# Patient Record
Sex: Male | Born: 1970 | Race: White | Hispanic: No | Marital: Single | Smoking: Never smoker
Health system: Southern US, Community
[De-identification: ages and names within clinical notes are randomized; demographics above are authoritative.]

## PROBLEM LIST (undated history)

## (undated) DIAGNOSIS — I471 Supraventricular tachycardia: Secondary | ICD-10-CM

## (undated) HISTORY — PX: ABLATION: SHX5711

---

## 2008-11-13 ENCOUNTER — Emergency Department: Payer: Self-pay | Admitting: Internal Medicine

## 2008-11-17 ENCOUNTER — Ambulatory Visit: Payer: Self-pay | Admitting: Internal Medicine

## 2010-07-22 ENCOUNTER — Emergency Department: Payer: Self-pay | Admitting: Unknown Physician Specialty

## 2014-04-10 ENCOUNTER — Emergency Department: Payer: Self-pay | Admitting: Emergency Medicine

## 2014-04-10 LAB — MAGNESIUM: Magnesium: 2 mg/dL

## 2014-04-10 LAB — CBC
HCT: 47.2 % (ref 40.0–52.0)
HGB: 15.5 g/dL (ref 13.0–18.0)
MCH: 30.2 pg (ref 26.0–34.0)
MCHC: 32.8 g/dL (ref 32.0–36.0)
MCV: 92 fL (ref 80–100)
PLATELETS: 291 10*3/uL (ref 150–440)
RBC: 5.12 10*6/uL (ref 4.40–5.90)
RDW: 12.7 % (ref 11.5–14.5)
WBC: 9.9 10*3/uL (ref 3.8–10.6)

## 2014-04-10 LAB — BASIC METABOLIC PANEL
BUN: 15 mg/dL (ref 7–18)
CALCIUM: 8.4 mg/dL — AB (ref 8.5–10.1)
CREATININE: 1.36 mg/dL — AB (ref 0.60–1.30)
Chloride: 105 mmol/L (ref 98–107)
Co2: 26 mmol/L (ref 21–32)
GLUCOSE: 129 mg/dL — AB (ref 65–99)
POTASSIUM: 3.8 mmol/L (ref 3.5–5.1)
Sodium: 142 mmol/L (ref 136–145)

## 2014-10-27 ENCOUNTER — Emergency Department
Admit: 2014-10-27 | Disposition: A | Discharge status: Home / Self Care | Payer: Self-pay | Admitting: Emergency Medicine

## 2014-10-27 LAB — BASIC METABOLIC PANEL
Anion Gap: 11 (ref 7–16)
BUN: 16 mg/dL
CALCIUM: 9.1 mg/dL
Chloride: 106 mmol/L
Co2: 24 mmol/L
Creatinine: 1.16 mg/dL
EGFR (African American): >60
EGFR (Non-African Amer.): >60
GLUCOSE: 134 mg/dL — AB
Potassium: 3.9 mmol/L
Sodium: 141 mmol/L

## 2014-10-27 LAB — CBC
HCT: 47.3 % (ref 40.0–52.0)
HGB: 15.7 g/dL (ref 13.0–18.0)
MCH: 30.2 pg (ref 26.0–34.0)
MCHC: 33.3 g/dL (ref 32.0–36.0)
MCV: 91 fL (ref 80–100)
Platelet: 279 10*3/uL (ref 150–440)
RBC: 5.21 10*6/uL (ref 4.40–5.90)
RDW: 12.8 % (ref 11.5–14.5)
WBC: 9.8 10*3/uL (ref 3.8–10.6)

## 2014-12-31 DIAGNOSIS — I471 Supraventricular tachycardia, unspecified: Secondary | ICD-10-CM

## 2014-12-31 HISTORY — DX: Supraventricular tachycardia: I47.1

## 2014-12-31 HISTORY — DX: Supraventricular tachycardia, unspecified: I47.10

## 2017-06-12 ENCOUNTER — Ambulatory Visit
Admission: EM | Admit: 2017-06-12 | Discharge: 2017-06-12 | Disposition: A | Discharge status: Home / Self Care | Payer: BLUE CROSS/BLUE SHIELD | Attending: Family Medicine | Admitting: Family Medicine

## 2017-06-12 ENCOUNTER — Other Ambulatory Visit: Payer: Self-pay

## 2017-06-12 DIAGNOSIS — S46811A Strain of other muscles, fascia and tendons at shoulder and upper arm level, right arm, initial encounter: Secondary | ICD-10-CM

## 2017-06-12 DIAGNOSIS — X500XXA Overexertion from strenuous movement or load, initial encounter: Secondary | ICD-10-CM

## 2017-06-12 HISTORY — DX: Supraventricular tachycardia: I47.1

## 2017-06-12 MED ORDER — KETOROLAC TROMETHAMINE 60 MG/2ML IM SOLN
60.0000 mg | Freq: Once | INTRAMUSCULAR | Status: AC
  Administered 2017-06-12: 60 mg via INTRAMUSCULAR

## 2017-06-12 MED ORDER — CYCLOBENZAPRINE HCL 10 MG PO TABS: 10.0000 mg | ORAL_TABLET | Freq: Two times a day (BID) | ORAL | 0 refills | Status: DC | PRN

## 2017-06-12 MED ORDER — MELOXICAM 15 MG PO TABS
15.0000 mg | ORAL_TABLET | Freq: Every day | ORAL | 0 refills | Status: DC | PRN
Start: 2017-06-12 — End: 2017-06-16

## 2017-06-12 NOTE — ED Triage Notes (Signed)
Rt shoulder and neck pain since yesterday shoveling snow.

## 2017-06-12 NOTE — ED Provider Notes (Signed)
[facility] ____________________________________________  Time seen: Approximately 8:51 AM  I have reviewed the triage vital signs and the nursing notes.   HISTORY  Chief Complaint Shoulder Pain   HPI Jeremy AmericanJoseph A Yakel Jr. is a 46 y.o. male presenting for evaluation of right neck shoulder pain that started yesterday afternoon.  Patient reports he is right-hand dominant and has been doing a lot of shoveling snow the last few days.  Also reports that he went bowling yesterday.  States yesterday afternoon he started noticing some pain that he felt like has overall worsened.  Denies pain radiation, decreased strength, fall.  Patient reports yesterday he had a few brief intermittent episodes of a slight numbness in his right forearm that lasted only a few seconds and has not been continuous.  Denies any numbness at this time.  States pain is to his right shoulder and is constant, but does worsen with arm movement.  Denies any fall or direct injury.  States has tried over-the-counter ibuprofen and used his wife's TENS unit last night without change.  Denies associated rash, chest pain, shortness of breath, weakness, dizziness, other complaints.  Denies known fevers.  Reports otherwise feels well.  Denies chest pain, shortness of breath, abdominal pain, or rash. Denies recent sickness. Denies recent antibiotic use.  Denies renal insufficiency.    Past Medical History:  Diagnosis Date  . SVT (supraventricular tachycardia) (HCC) 12/2014    There are no active problems to display for this patient.   Past Surgical History:  Procedure Laterality Date  . ABLATION     SVT     No current facility-administered medications for this encounter.   Current Outpatient Medications:  .  cyclobenzaprine (FLEXERIL) 10 MG tablet, Take 1 tablet (10 mg total) by mouth 2 (two) times daily as needed for muscle spasms. Do not drive while taking as can cause drowsiness, Disp: 15 tablet, Rfl: 0 .   meloxicam (MOBIC) 15 MG tablet, Take 1 tablet (15 mg total) by mouth daily as needed., Disp: 10 tablet, Rfl: 0  Allergies Patient has no allergy information on record.  Family History  Problem Relation Age of Onset  . Cancer Father   . Diabetes Maternal Grandmother     Social History Social History   Tobacco Use  . Smoking status: Never Smoker  . Smokeless tobacco: Never Used  Substance Use Topics  . Alcohol use: Yes  . Drug use: Not on file    Review of Systems Constitutional: No fever/chills Cardiovascular: Denies chest pain. Respiratory: Denies shortness of breath. Gastrointestinal: No abdominal pain.  Musculoskeletal: As above.  Skin: Negative for rash.   ____________________________________________  PHYSICAL EXAM:   VITAL SIGNS:  ED Triage Vitals  Enc Vitals Group     BP --      Pulse Rate 06/12/17 0834 76     Resp 06/12/17 0834 16     Temp 06/12/17 0834 98.1 F (36.7 C)     Temp Source 06/12/17 0834 Oral     SpO2 06/12/17 0834 99 %     Weight 06/12/17 0835 204 lb 9.6 oz (92.8 kg)     Height 06/12/17 0835 5\' 8"  (1.727 m)     Head Circumference --      Peak Flow --      Pain Score 06/12/17 0836 8     Pain Loc --      Pain Edu? --      Excl. in GC? --    Vitals:  06/12/17 0834 06/12/17 0835 06/12/17 1007  BP:   126/82  Pulse: 76    Resp: 16    Temp: 98.1 F (36.7 C)    TempSrc: Oral    SpO2: 99%    Weight:  204 lb 9.6 oz (92.8 kg)   Height:  5\' 8"  (1.727 m)      Constitutional: Alert and oriented. Well appearing and in no acute distress. ENT      Head: Normocephalic and atraumatic. Neck: No stridor. Supple without meningismus.  Hematological/Lymphatic/Immunilogical: No cervical lymphadenopathy. Cardiovascular: Normal rate, regular rhythm. Grossly normal heart sounds.  Good peripheral circulation. Respiratory: Normal respiratory effort without tachypnea nor retractions. Breath sounds are clear and equal bilaterally. No wheezes, rales,  rhonchi. Musculoskeletal: . No midline cervical, thoracic or lumbar tenderness to palpation. Bilateral distal radial pulses equal and easily palpated.   Except: Right trapezius mild to moderate tenderness to direct palpation, no pain with cervical flexion or extension, pain to right trapezius with cervical rotation right and left, mild palpable muscle spasm, no midline tenderness, no bony shoulder tenderness, negative drop arm test, negative impingement, full range of motion present to right shoulder, bilateral hand grip strong and equal, no motor or tendon deficits noted to right upper extremity and sensation intact to upper extremities bilaterally. Neurologic:  Normal speech and language. No gross focal neurologic deficits are appreciated. Speech is normal. No gait instability.  Skin:  Skin is warm, dry and intact. No rash noted. Psychiatric: Mood and affect are normal. Speech and behavior are normal. Patient exhibits appropriate insight and judgment   ___________________________________________   LABS (all labs ordered are listed, but only abnormal results are displayed)  Labs Reviewed - No data to display ____________________________________________   PROCEDURES Procedures    INITIAL IMPRESSION / ASSESSMENT AND PLAN / ED COURSE  Pertinent labs & imaging results that were available during my care of the patient were reviewed by me and considered in my medical decision making (see chart for details).  Well appearing. No acute distress.  Suspect right trapezius muscle strain.  60 mg IM Toradol given once in urgent care.  Will treat with daily Mobic and as needed muscle relaxant.  Encourage rest, fluids, stretching, avoidance of strenuous activity and supportive care. Discussed indication, risks and benefits of medications with patient.  Discussed follow up with Primary care physician this week. Discussed follow up and return parameters including no resolution or any worsening concerns.  Patient verbalized understanding and agreed to plan.   ____________________________________________   FINAL CLINICAL IMPRESSION(S) / ED DIAGNOSES  Final diagnoses:  Strain of right trapezius muscle, initial encounter     ED Discharge Orders        Ordered    meloxicam (MOBIC) 15 MG tablet  Daily PRN     06/12/17 0912    cyclobenzaprine (FLEXERIL) 10 MG tablet  2 times daily PRN     06/12/17 0912       Note: This dictation was prepared with Dragon dictation along with smaller phrase technology. Any transcriptional errors that result from this process are unintentional.         Renford DillsMiller, Docie Abramovich, NP 06/12/17 1306

## 2017-06-12 NOTE — Discharge Instructions (Signed)
Take medication as prescribed. Rest. Drink plenty of fluids. Stretch. Avoid strenuous activity. No alcohol with muscle relaxant.  Follow up with your primary care physician this week as needed. Return to Urgent care for new or worsening concerns.

## 2017-06-15 ENCOUNTER — Emergency Department: Payer: BLUE CROSS/BLUE SHIELD

## 2017-06-15 ENCOUNTER — Telehealth: Payer: Self-pay

## 2017-06-15 ENCOUNTER — Emergency Department
Admission: EM | Admit: 2017-06-15 | Discharge: 2017-06-15 | Disposition: A | Discharge status: Home / Self Care | Payer: BLUE CROSS/BLUE SHIELD | Attending: Emergency Medicine | Admitting: Emergency Medicine

## 2017-06-15 ENCOUNTER — Encounter: Payer: Self-pay | Admitting: Radiology

## 2017-06-15 DIAGNOSIS — R509 Fever, unspecified: Secondary | ICD-10-CM | POA: Insufficient documentation

## 2017-06-15 DIAGNOSIS — M25511 Pain in right shoulder: Secondary | ICD-10-CM | POA: Insufficient documentation

## 2017-06-15 LAB — TROPONIN I

## 2017-06-15 LAB — COMPREHENSIVE METABOLIC PANEL
ALK PHOS: 63 U/L (ref 38–126)
ALT: 53 U/L (ref 17–63)
ANION GAP: 10 (ref 5–15)
AST: 38 U/L (ref 15–41)
Albumin: 3.6 g/dL (ref 3.5–5.0)
BUN: 13 mg/dL (ref 6–20)
CALCIUM: 8.6 mg/dL — AB (ref 8.9–10.3)
CO2: 21 mmol/L — AB (ref 22–32)
Chloride: 103 mmol/L (ref 101–111)
Creatinine, Ser: 1.08 mg/dL (ref 0.61–1.24)
GFR calc non Af Amer: >60 mL/min (ref >60)
Glucose, Bld: 140 mg/dL — ABNORMAL HIGH (ref 65–99)
Potassium: 3.2 mmol/L — ABNORMAL LOW (ref 3.5–5.1)
SODIUM: 134 mmol/L — AB (ref 135–145)
TOTAL PROTEIN: 7.7 g/dL (ref 6.5–8.1)
Total Bilirubin: 1.6 mg/dL — ABNORMAL HIGH (ref 0.3–1.2)

## 2017-06-15 LAB — BLOOD CULTURE ID PANEL (REFLEXED)
Acinetobacter baumannii: NOT DETECTED
CANDIDA ALBICANS: NOT DETECTED
CANDIDA GLABRATA: NOT DETECTED
Candida krusei: NOT DETECTED
Candida parapsilosis: NOT DETECTED
Candida tropicalis: NOT DETECTED
ENTEROBACTER CLOACAE COMPLEX: NOT DETECTED
ENTEROBACTERIACEAE SPECIES: NOT DETECTED
ENTEROCOCCUS SPECIES: NOT DETECTED
Escherichia coli: NOT DETECTED
Haemophilus influenzae: NOT DETECTED
Klebsiella oxytoca: NOT DETECTED
Klebsiella pneumoniae: NOT DETECTED
LISTERIA MONOCYTOGENES: NOT DETECTED
METHICILLIN RESISTANCE: NOT DETECTED
NEISSERIA MENINGITIDIS: NOT DETECTED
Proteus species: NOT DETECTED
Pseudomonas aeruginosa: NOT DETECTED
STAPHYLOCOCCUS SPECIES: DETECTED — AB
STREPTOCOCCUS AGALACTIAE: NOT DETECTED
STREPTOCOCCUS SPECIES: NOT DETECTED
Serratia marcescens: NOT DETECTED
Staphylococcus aureus (BCID): DETECTED — AB
Streptococcus pneumoniae: NOT DETECTED
Streptococcus pyogenes: NOT DETECTED

## 2017-06-15 LAB — CBC WITH DIFFERENTIAL/PLATELET
BASOS ABS: 0 10*3/uL (ref 0–0.1)
BASOS PCT: 0 %
Eosinophils Absolute: 0 10*3/uL (ref 0–0.7)
Eosinophils Relative: 0 %
HEMATOCRIT: 39.5 % — AB (ref 40.0–52.0)
HEMOGLOBIN: 13.8 g/dL (ref 13.0–18.0)
Lymphocytes Relative: 8 %
Lymphs Abs: 0.7 10*3/uL — ABNORMAL LOW (ref 1.0–3.6)
MCH: 30.9 pg (ref 26.0–34.0)
MCHC: 35 g/dL (ref 32.0–36.0)
MCV: 88.2 fL (ref 80.0–100.0)
MONOS PCT: 12 %
Monocytes Absolute: 1 10*3/uL (ref 0.2–1.0)
NEUTROS ABS: 6.8 10*3/uL — AB (ref 1.4–6.5)
NEUTROS PCT: 80 %
Platelets: 205 10*3/uL (ref 150–440)
RBC: 4.47 MIL/uL (ref 4.40–5.90)
RDW: 12.6 % (ref 11.5–14.5)
WBC: 8.5 10*3/uL (ref 3.8–10.6)

## 2017-06-15 LAB — INFLUENZA PANEL BY PCR (TYPE A & B)
Influenza A By PCR: NEGATIVE
Influenza B By PCR: NEGATIVE

## 2017-06-15 LAB — LACTIC ACID, PLASMA: Lactic Acid, Venous: 1.3 mmol/L (ref 0.5–1.9)

## 2017-06-15 MED ORDER — HYDROMORPHONE HCL 1 MG/ML IJ SOLN
1.0000 mg | Freq: Once | INTRAMUSCULAR | Status: AC
  Administered 2017-06-15: 1 mg via INTRAVENOUS
  Filled 2017-06-15: qty 1

## 2017-06-15 MED ORDER — SODIUM CHLORIDE 0.9 % IV SOLN
Freq: Once | INTRAVENOUS | Status: AC
  Administered 2017-06-15: 07:00:00 via INTRAVENOUS

## 2017-06-15 MED ORDER — KETOROLAC TROMETHAMINE 30 MG/ML IJ SOLN
30.0000 mg | Freq: Once | INTRAMUSCULAR | Status: AC
  Administered 2017-06-15: 30 mg via INTRAVENOUS
  Filled 2017-06-15: qty 1

## 2017-06-15 MED ORDER — DIAZEPAM 5 MG PO TABS: 5.0000 mg | ORAL_TABLET | Freq: Three times a day (TID) | ORAL | 0 refills | Status: DC | PRN

## 2017-06-15 MED ORDER — IBUPROFEN 800 MG PO TABS: 800.0000 mg | ORAL_TABLET | Freq: Three times a day (TID) | ORAL | 0 refills | Status: DC | PRN

## 2017-06-15 MED ORDER — DIAZEPAM 5 MG PO TABS
10.0000 mg | ORAL_TABLET | Freq: Once | ORAL | Status: AC
  Administered 2017-06-15: 10 mg via ORAL
  Filled 2017-06-15: qty 2

## 2017-06-15 MED ORDER — HYDROMORPHONE HCL 1 MG/ML IJ SOLN
0.5000 mg | Freq: Once | INTRAMUSCULAR | Status: AC
  Administered 2017-06-15: 0.5 mg via INTRAVENOUS
  Filled 2017-06-15: qty 1

## 2017-06-15 MED ORDER — IOPAMIDOL (ISOVUE-300) INJECTION 61%
75.0000 mL | Freq: Once | INTRAVENOUS | Status: AC | PRN
  Administered 2017-06-15: 75 mL via INTRAVENOUS

## 2017-06-15 NOTE — ED Provider Notes (Signed)
[facility] Emergency Department Provider Note       Time seen: ----------------------------------------- 7:09 AM on 06/15/2017 -----------------------------------------   I have reviewed the triage vital signs and the nursing notes.  HISTORY   Chief Complaint Shoulder Pain    HPI Jeremy AmericanJoseph A Andal Jr. is a 46 y.o. male with a history of SVT who presents to the ED for right shoulder pain.  Patient is not able to move his right shoulder well.  He was seen in urgent care this week for similar was given Flexeril for was reported to be trapezius strain.  Patient states pain extends from his anterior shoulder to the right lateral neck and behind his back.  Patient reports she had a temperature of 101 yesterday.  He denies congestion, cough, nausea, vomiting or diarrhea.  Past Medical History:  Diagnosis Date  . SVT (supraventricular tachycardia) (HCC) 12/2014    There are no active problems to display for this patient.   Past Surgical History:  Procedure Laterality Date  . ABLATION     SVT    Allergies Patient has no known allergies.  Social History Social History   Tobacco Use  . Smoking status: Never Smoker  . Smokeless tobacco: Never Used  Substance Use Topics  . Alcohol use: Yes  . Drug use: Not on file    Review of Systems Constitutional: Positive for fever Eyes: Negative for vision changes ENT:  Negative for congestion, sore throat Cardiovascular: Negative for chest pain. Respiratory: Negative for shortness of breath. Gastrointestinal: Negative for abdominal pain, vomiting and diarrhea. Genitourinary: Negative for dysuria. Musculoskeletal: Positive for right shoulder pain Skin: Positive for diaphoresis Neurological: Negative for headaches, focal weakness or numbness.  All systems negative/normal/unremarkable except as stated in the HPI  ____________________________________________   PHYSICAL EXAM:  VITAL SIGNS: ED Triage Vitals   Enc Vitals Group     BP 06/15/17 0653 138/72     Pulse Rate 06/15/17 0653 (!) 111     Resp 06/15/17 0653 (!) 22     Temp 06/15/17 0653 (!) 100.4 F (38 C)     Temp Source 06/15/17 0653 Oral     SpO2 06/15/17 0653 95 %     Weight 06/15/17 0654 200 lb (90.7 kg)     Height 06/15/17 0654 5\' 8"  (1.727 m)     Head Circumference --      Peak Flow --      Pain Score 06/15/17 0653 9     Pain Loc --      Pain Edu? --      Excl. in GC? --     Constitutional: Alert and oriented.  Mild distress Eyes: Conjunctivae are normal. Normal extraocular movements. ENT   Head: Normocephalic and atraumatic.   Nose: No congestion/rhinnorhea.   Mouth/Throat: Mucous membranes are moist.   Neck: No stridor. Cardiovascular: Normal rate, regular rhythm. No murmurs, rubs, or gallops. Respiratory: Normal respiratory effort without tachypnea nor retractions. Breath sounds are clear and equal bilaterally. No wheezes/rales/rhonchi. Gastrointestinal: Soft and nontender. Normal bowel sounds Musculoskeletal: Significant pain with range of motion the right shoulder, no sensory or motor deficits are appreciated.  Specific tenderness over the trapezius but also right upper chest and right anterior lateral neck.  No specific tenderness or effusion is appreciated at the right shoulder Neurologic:  Normal speech and language. No gross focal neurologic deficits are appreciated.  Skin:  Skin is warm, with profuse diaphoresis Psychiatric: Mood and affect are normal. Speech and behavior are  normal.  ____________________________________________  EKG: Interpreted by me.  Sinus rhythm rate 71 bpm, normal PR interval, normal QRS, normal QT.  ____________________________________________  ED COURSE:  Pertinent labs & imaging results that were available during my care of the patient were reviewed by me and considered in my medical decision making (see chart for details). Patient presents for severe right shoulder  pain as well as fever, we will assess with labs and imaging as indicated.  Unclear etiology at this time Clinical Course as of Jun 15 1108  Sat Jun 15, 2017  [id number] IMPRESSION: 1. No definite acute or inflammatory process in the neck. 2. Scattered ethmoid and sphenoid paranasal sinusitis. 3. 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     [JW]    Clinical Course User Index [JW] Emily FilbertWilliams, Eion Timbrook E, MD   Procedures ____________________________________________   LABS (pertinent positives/negatives)  Labs Reviewed  COMPREHENSIVE METABOLIC PANEL - Abnormal; Notable for the following components:      Result Value   Sodium 134 (*)    Potassium 3.2 (*)    CO2 21 (*)    Glucose, Bld 140 (*)    Calcium 8.6 (*)    Total Bilirubin 1.6 (*)    All other components within normal limits  CBC WITH DIFFERENTIAL/PLATELET - Abnormal; Notable for the following components:   HCT 39.5 (*)    Neutro Abs 6.8 (*)    Lymphs Abs 0.7 (*)    All other components within normal limits  CULTURE, BLOOD (ROUTINE X 2)  CULTURE, BLOOD (ROUTINE X 2)  LACTIC ACID, PLASMA  INFLUENZA PANEL BY PCR (TYPE A & B)  TROPONIN I  URINALYSIS, COMPLETE (UACMP) WITH MICROSCOPIC    RADIOLOGY Images were viewed by me  Shoulder x-rays IMPRESSION: Bibasilar pulmonary opacities. The streaky and somewhat platelike nature suggests the possibility of atelectasis. However, given the history of fever, pneumonia is not excluded. Recommend follow-up to Resolution. IMPRESSION: 1. No definite acute or inflammatory process in the neck. 2. Scattered ethmoid and sphenoid paranasal sinusitis. 3. 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 ____________________________________________  DIFFERENTIAL DIAGNOSIS   Viral syndrome, muscle strain, bacteremia, abscess,  FINAL ASSESSMENT AND  PLAN  Fever, right shoulder pain   Plan: Patient had presented for worsening right shoulder pain as well as fever and diaphoresis. Patient's labs were reassuring. Patient's imaging was also reassuring.  No clear etiology for his constellation of symptoms.  I think Valium would work better for muscle relaxant with ibuprofen.  His fever is likely coming from a viral etiology.  He is stable for outpatient follow-up.   Emily FilbertWilliams, Royer Cristobal E, MD   Note: This note was generated in part or whole with voice recognition software. Voice recognition is usually quite accurate but there are transcription errors that can and very often do occur. I apologize for any typographical errors that were not detected and corrected.     Emily FilbertWilliams, Kiyani Jernigan E, MD 06/15/17 [phone]

## 2017-06-15 NOTE — Telephone Encounter (Signed)
Called and left a message for patient's wife to have the patient call back to ED in reference to possible admission for MSSA reported by the lab.

## 2017-06-15 NOTE — ED Triage Notes (Addendum)
Pt arrives to triage complaining of right shoulder pain. Pt is not able to adduct or abduct right shoulder. Pt states pain extends to right anterior shoulder and up right lateral neck to posterior skull. Pt is hot to touch and diaphoretic. Pt states he did have a fever of 101 yesterday at 1400. Pt appear uncomfortable. Pt denies shob or nausea.

## 2017-06-15 NOTE — ED Notes (Signed)
md at bedside

## 2017-06-15 NOTE — Progress Notes (Signed)
PHARMACY - PHYSICIAN COMMUNICATION CRITICAL VALUE ALERT - BLOOD CULTURE IDENTIFICATION (BCID)  Jeremy AmericanJoseph A Pigman Jr. is an 46 y.o. male who [facility]2017 with a chief complaint of shoulder pain  Assessment:   (include suspected source if known)  Name of physician (or Provider) Contacted: ED charge RN  Current antibiotics: seen and d/c from ED.  Changes to prescribed antibiotics recommended:  ED RN to follow up with EDP regarding therapy.  Results for orders placed or performed during the hospital encounter of 06/15/17  Blood Culture ID Panel (Reflexed) (Collected: 06/15/2017  7:05 AM)  Result Value Ref Range   Enterococcus species NOT DETECTED NOT DETECTED   Listeria monocytogenes NOT DETECTED NOT DETECTED   Staphylococcus species DETECTED (A) NOT DETECTED   Staphylococcus aureus DETECTED (A) NOT DETECTED   Methicillin resistance NOT DETECTED NOT DETECTED   Streptococcus species NOT DETECTED NOT DETECTED   Streptococcus agalactiae NOT DETECTED NOT DETECTED   Streptococcus pneumoniae NOT DETECTED NOT DETECTED   Streptococcus pyogenes NOT DETECTED NOT DETECTED   Acinetobacter baumannii NOT DETECTED NOT DETECTED   Enterobacteriaceae species NOT DETECTED NOT DETECTED   Enterobacter cloacae complex NOT DETECTED NOT DETECTED   Escherichia coli NOT DETECTED NOT DETECTED   Klebsiella oxytoca NOT DETECTED NOT DETECTED   Klebsiella pneumoniae NOT DETECTED NOT DETECTED   Proteus species NOT DETECTED NOT DETECTED   Serratia marcescens NOT DETECTED NOT DETECTED   Haemophilus influenzae NOT DETECTED NOT DETECTED   Neisseria meningitidis NOT DETECTED NOT DETECTED   Pseudomonas aeruginosa NOT DETECTED NOT DETECTED   Candida albicans NOT DETECTED NOT DETECTED   Candida glabrata NOT DETECTED NOT DETECTED   Candida krusei NOT DETECTED NOT DETECTED   Candida parapsilosis NOT DETECTED NOT DETECTED   Candida tropicalis NOT DETECTED NOT DETECTED    Jeremy Teska  Humphrey 06/15/2017  11:26 PM

## 2017-06-15 NOTE — ED Notes (Signed)
Report to butch, rn.  

## 2017-06-16 ENCOUNTER — Other Ambulatory Visit: Payer: Self-pay

## 2017-06-16 ENCOUNTER — Inpatient Hospital Stay
Admit: 2017-06-16 | Discharge: 2017-06-16 | Disposition: A | Discharge status: Home / Self Care | Payer: BLUE CROSS/BLUE SHIELD | Attending: Internal Medicine | Admitting: Internal Medicine

## 2017-06-16 ENCOUNTER — Emergency Department: Payer: BLUE CROSS/BLUE SHIELD

## 2017-06-16 ENCOUNTER — Inpatient Hospital Stay
Admission: EM | Admit: 2017-06-16 | Discharge: 2017-06-19 | DRG: 871 | Disposition: A | Discharge status: Home Health | Payer: BLUE CROSS/BLUE SHIELD | Attending: Internal Medicine | Admitting: Internal Medicine

## 2017-06-16 DIAGNOSIS — I471 Supraventricular tachycardia: Secondary | ICD-10-CM | POA: Diagnosis present

## 2017-06-16 DIAGNOSIS — J189 Pneumonia, unspecified organism: Secondary | ICD-10-CM | POA: Diagnosis present

## 2017-06-16 DIAGNOSIS — M25519 Pain in unspecified shoulder: Secondary | ICD-10-CM

## 2017-06-16 DIAGNOSIS — E876 Hypokalemia: Secondary | ICD-10-CM | POA: Diagnosis present

## 2017-06-16 DIAGNOSIS — A4101 Sepsis due to Methicillin susceptible Staphylococcus aureus: Secondary | ICD-10-CM | POA: Diagnosis present

## 2017-06-16 DIAGNOSIS — A419 Sepsis, unspecified organism: Secondary | ICD-10-CM | POA: Diagnosis present

## 2017-06-16 DIAGNOSIS — Z79899 Other long term (current) drug therapy: Secondary | ICD-10-CM | POA: Diagnosis not present

## 2017-06-16 DIAGNOSIS — M25511 Pain in right shoulder: Secondary | ICD-10-CM | POA: Diagnosis present

## 2017-06-16 DIAGNOSIS — R7881 Bacteremia: Secondary | ICD-10-CM | POA: Diagnosis present

## 2017-06-16 DIAGNOSIS — Z6830 Body mass index (BMI) 30.0-30.9, adult: Secondary | ICD-10-CM | POA: Diagnosis not present

## 2017-06-16 DIAGNOSIS — E669 Obesity, unspecified: Secondary | ICD-10-CM | POA: Diagnosis present

## 2017-06-16 LAB — COMPREHENSIVE METABOLIC PANEL
ALBUMIN: 3.4 g/dL — AB (ref 3.5–5.0)
ALK PHOS: 63 U/L (ref 38–126)
ALT: 70 U/L — ABNORMAL HIGH (ref 17–63)
AST: 47 U/L — AB (ref 15–41)
Anion gap: 9 (ref 5–15)
BILIRUBIN TOTAL: 1.7 mg/dL — AB (ref 0.3–1.2)
BUN: 14 mg/dL (ref 6–20)
CALCIUM: 8.6 mg/dL — AB (ref 8.9–10.3)
CO2: 25 mmol/L (ref 22–32)
Chloride: 102 mmol/L (ref 101–111)
Creatinine, Ser: 1.13 mg/dL (ref 0.61–1.24)
GFR calc Af Amer: >60 mL/min (ref >60)
GFR calc non Af Amer: >60 mL/min (ref >60)
GLUCOSE: 130 mg/dL — AB (ref 65–99)
Potassium: 3.2 mmol/L — ABNORMAL LOW (ref 3.5–5.1)
Sodium: 136 mmol/L (ref 135–145)
TOTAL PROTEIN: 7.4 g/dL (ref 6.5–8.1)

## 2017-06-16 LAB — PROTIME-INR
INR: 1.16
Prothrombin Time: 14.7 seconds (ref 11.4–15.2)

## 2017-06-16 LAB — CBC WITH DIFFERENTIAL/PLATELET
Basophils Absolute: 0 10*3/uL (ref 0–0.1)
Basophils Relative: 0 %
EOS ABS: 0 10*3/uL (ref 0–0.7)
EOS PCT: 0 %
HCT: 39.1 % — ABNORMAL LOW (ref 40.0–52.0)
HEMOGLOBIN: 13.4 g/dL (ref 13.0–18.0)
LYMPHS ABS: 0.3 10*3/uL — AB (ref 1.0–3.6)
Lymphocytes Relative: 4 %
MCH: 30.6 pg (ref 26.0–34.0)
MCHC: 34.3 g/dL (ref 32.0–36.0)
MCV: 89.4 fL (ref 80.0–100.0)
MONOS PCT: 5 %
Monocytes Absolute: 0.4 10*3/uL (ref 0.2–1.0)
NEUTROS PCT: 91 %
Neutro Abs: 6.8 10*3/uL — ABNORMAL HIGH (ref 1.4–6.5)
Platelets: 194 10*3/uL (ref 150–440)
RBC: 4.37 MIL/uL — ABNORMAL LOW (ref 4.40–5.90)
RDW: 12.6 % (ref 11.5–14.5)
WBC: 7.5 10*3/uL (ref 3.8–10.6)

## 2017-06-16 LAB — TROPONIN I: Troponin I: <0.03 ng/mL (ref <0.03)

## 2017-06-16 LAB — LACTIC ACID, PLASMA
LACTIC ACID, VENOUS: 1.6 mmol/L (ref 0.5–1.9)
LACTIC ACID, VENOUS: 1.4 mmol/L (ref 0.5–1.9)
Lactic Acid, Venous: 1.6 mmol/L (ref 0.5–1.9)
Lactic Acid, Venous: 2.6 mmol/L (ref 0.5–1.9)

## 2017-06-16 LAB — ECHOCARDIOGRAM COMPLETE
Height: 68 in
Weight: 3200 oz

## 2017-06-16 LAB — URINALYSIS, ROUTINE W REFLEX MICROSCOPIC
Bacteria, UA: NONE SEEN
Bilirubin Urine: NEGATIVE
Glucose, UA: 50 mg/dL — AB
Hgb urine dipstick: NEGATIVE
Ketones, ur: NEGATIVE mg/dL
Leukocytes, UA: NEGATIVE
Nitrite: NEGATIVE
Protein, ur: 100 mg/dL — AB
Specific Gravity, Urine: 1.031 — ABNORMAL HIGH (ref 1.005–1.030)
pH: 5 (ref 5.0–8.0)

## 2017-06-16 LAB — LIPASE, BLOOD: Lipase: 18 U/L (ref 11–51)

## 2017-06-16 LAB — PROCALCITONIN: PROCALCITONIN: 0.76 ng/mL

## 2017-06-16 LAB — C-REACTIVE PROTEIN: CRP: 21.5 mg/dL — AB (ref <1.0)

## 2017-06-16 LAB — SEDIMENTATION RATE: SED RATE: 67 mm/h — AB (ref 0–15)

## 2017-06-16 MED ORDER — SODIUM CHLORIDE 0.9 % IV BOLUS (SEPSIS)
1000.0000 mL | Freq: Once | INTRAVENOUS | Status: AC
  Administered 2017-06-16: 1000 mL via INTRAVENOUS

## 2017-06-16 MED ORDER — CEFAZOLIN SODIUM-DEXTROSE 2-4 GM/100ML-% IV SOLN: 2.0000 g | Freq: Once | INTRAVENOUS | Status: DC

## 2017-06-16 MED ORDER — CEFAZOLIN SODIUM-DEXTROSE 2-4 GM/100ML-% IV SOLN
2.0000 g | Freq: Three times a day (TID) | INTRAVENOUS | Status: DC
  Filled 2017-06-16 (×2): qty 100

## 2017-06-16 MED ORDER — PRAVASTATIN SODIUM 20 MG PO TABS
20.0000 mg | ORAL_TABLET | Freq: Every day | ORAL | Status: DC
Start: 2017-06-16 — End: 2017-06-19
  Administered 2017-06-16 – 2017-06-18 (×3): 20 mg via ORAL
  Filled 2017-06-16 (×3): qty 1

## 2017-06-16 MED ORDER — IBUPROFEN 400 MG PO TABS
400.0000 mg | ORAL_TABLET | Freq: Four times a day (QID) | ORAL | Status: DC | PRN
  Administered 2017-06-16 – 2017-06-18 (×8): 400 mg via ORAL
  Filled 2017-06-16 (×8): qty 1

## 2017-06-16 MED ORDER — ACETAMINOPHEN 650 MG RE SUPP: 650.0000 mg | Freq: Four times a day (QID) | RECTAL | Status: DC | PRN

## 2017-06-16 MED ORDER — ACETAMINOPHEN 325 MG PO TABS
650.0000 mg | ORAL_TABLET | Freq: Four times a day (QID) | ORAL | Status: DC | PRN
  Administered 2017-06-16 – 2017-06-17 (×4): 650 mg via ORAL
  Filled 2017-06-16 (×4): qty 2

## 2017-06-16 MED ORDER — CYCLOBENZAPRINE HCL 10 MG PO TABS
10.0000 mg | ORAL_TABLET | Freq: Two times a day (BID) | ORAL | Status: DC | PRN
  Administered 2017-06-17 – 2017-06-18 (×3): 10 mg via ORAL
  Filled 2017-06-16 (×4): qty 1

## 2017-06-16 MED ORDER — ONDANSETRON HCL 4 MG/2ML IJ SOLN: 4.0000 mg | Freq: Four times a day (QID) | INTRAMUSCULAR | Status: DC | PRN

## 2017-06-16 MED ORDER — ONDANSETRON HCL 4 MG PO TABS: 4.0000 mg | ORAL_TABLET | Freq: Four times a day (QID) | ORAL | Status: DC | PRN

## 2017-06-16 MED ORDER — DIAZEPAM 5 MG PO TABS
5.0000 mg | ORAL_TABLET | Freq: Three times a day (TID) | ORAL | Status: DC | PRN
  Administered 2017-06-17 – 2017-06-18 (×3): 5 mg via ORAL
  Filled 2017-06-16 (×4): qty 1

## 2017-06-16 MED ORDER — IBUPROFEN 400 MG PO TABS
800.0000 mg | ORAL_TABLET | Freq: Three times a day (TID) | ORAL | Status: AC
  Administered 2017-06-16: 21:00:00 800 mg via ORAL
  Filled 2017-06-16: qty 2

## 2017-06-16 MED ORDER — DOCUSATE SODIUM 100 MG PO CAPS
100.0000 mg | ORAL_CAPSULE | Freq: Two times a day (BID) | ORAL | Status: DC
  Administered 2017-06-16 (×2): 100 mg via ORAL
  Filled 2017-06-16 (×2): qty 1

## 2017-06-16 MED ORDER — MORPHINE SULFATE (PF) 2 MG/ML IV SOLN
2.0000 mg | INTRAVENOUS | Status: DC | PRN
  Administered 2017-06-16: 09:00:00 2 mg via INTRAVENOUS
  Administered 2017-06-16 (×2): 4 mg via INTRAVENOUS
  Administered 2017-06-17: 06:00:00 2 mg via INTRAVENOUS
  Administered 2017-06-17 (×3): 4 mg via INTRAVENOUS
  Administered 2017-06-17: 01:00:00 2 mg via INTRAVENOUS
  Administered 2017-06-18 (×2): 4 mg via INTRAVENOUS
  Administered 2017-06-18 (×2): 2 mg via INTRAVENOUS
  Administered 2017-06-18: 12:00:00 4 mg via INTRAVENOUS
  Administered 2017-06-19: 2 mg via INTRAVENOUS
  Administered 2017-06-19: 01:00:00 4 mg via INTRAVENOUS
  Filled 2017-06-16 (×2): qty 2
  Filled 2017-06-16: qty 1
  Filled 2017-06-16 (×6): qty 2
  Filled 2017-06-16 (×4): qty 1
  Filled 2017-06-16: qty 2
  Filled 2017-06-16: qty 1

## 2017-06-16 MED ORDER — MORPHINE SULFATE (PF) 4 MG/ML IV SOLN
8.0000 mg | Freq: Once | INTRAVENOUS | Status: AC
  Administered 2017-06-16: 8 mg via INTRAVENOUS
  Filled 2017-06-16: qty 2

## 2017-06-16 MED ORDER — TRAMADOL HCL 50 MG PO TABS: 50.0000 mg | ORAL_TABLET | Freq: Four times a day (QID) | ORAL | Status: DC | PRN

## 2017-06-16 MED ORDER — ENOXAPARIN SODIUM 40 MG/0.4ML ~~LOC~~ SOLN
40.0000 mg | SUBCUTANEOUS | Status: DC
  Administered 2017-06-16 – 2017-06-18 (×3): 40 mg via SUBCUTANEOUS
  Filled 2017-06-16 (×4): qty 0.4

## 2017-06-16 MED ORDER — SODIUM CHLORIDE 0.9 % IV SOLN
INTRAVENOUS | Status: DC
  Administered 2017-06-16 – 2017-06-18 (×6): via INTRAVENOUS

## 2017-06-16 MED ORDER — DEXTROSE 5 % IV SOLN
2.0000 g | Freq: Three times a day (TID) | INTRAVENOUS | Status: DC
  Administered 2017-06-16 – 2017-06-19 (×10): 2 g via INTRAVENOUS
  Filled 2017-06-16 (×14): qty 2000

## 2017-06-16 MED ORDER — DEXTROSE 5 % IV SOLN
2.0000 g | Freq: Once | INTRAVENOUS | Status: AC
  Administered 2017-06-16: 2 g via INTRAVENOUS
  Filled 2017-06-16: qty 2000

## 2017-06-16 NOTE — Progress Notes (Signed)
Patient seen and examined this am. Patient with fever this am and shoulder pain right. MSSA bacteremia. D/w ortho about possible septic joint?? He RECS calling radiology for aspiration.  I have ordered MRI as well and PO pain meds. Cont ancef D/w wife

## 2017-06-16 NOTE — H&P (Signed)
Jeremy AmericanJoseph A Reeser Jr. is an 46 y.o. male.   Chief Complaint: Fever HPI: The patient with past medical history of SVT status post ablation presents to the emergency department complaining of fever.  The patient states he had a maximum temperature of 102.5 F at home.  He denies nausea and vomiting.  He still complains of right shoulder pain for which she was seen yesterday in the.  The pain began after he was shoveling snow.  He denies chest pain or shortness of breath.  The patient took ibuprofen before coming to the emergency department where he was afebrile.  However his pulse and respiratory rate were both elevated.  He was notified that blood cultures obtained today before for positive 4 out of 4 bottles for MSSA.  The patient was started on cefazolin and given fluid boluses prior to the emergency department staff calling the hospitalist service for admission.  Past Medical History:  Diagnosis Date  . SVT (supraventricular tachycardia) (HCC) 12/2014    Past Surgical History:  Procedure Laterality Date  . ABLATION     SVT    Family History  Problem Relation Age of Onset  . Cancer Father   . Diabetes Maternal Grandmother    Social History:  reports that  has never smoked. he has never used smokeless tobacco. He reports that he drinks alcohol. His drug history is not on file.  Allergies: No Known Allergies   (Not in a hospital admission)  Results for orders placed or performed during the hospital encounter of 06/16/17 (from the past 48 hour(s))  CBC WITH DIFFERENTIAL     Status: Abnormal   Collection Time: 06/16/17  5:48 AM  Result Value Ref Range   WBC 7.5 3.8 - 10.6 K/uL   RBC 4.37 (L) 4.40 - 5.90 MIL/uL   Hemoglobin 13.4 13.0 - 18.0 g/dL   HCT 40.939.1 (L) 81.140.0 - 91.452.0 %   MCV 89.4 80.0 - 100.0 fL   MCH 30.6 26.0 - 34.0 pg   MCHC 34.3 32.0 - 36.0 g/dL   RDW 78.212.6 95.611.5 - 21.314.5 %   Platelets 194 150 - 440 K/uL   Neutrophils Relative % 91 %   Neutro Abs 6.8 (H) 1.4 - 6.5 K/uL   Lymphocytes Relative 4 %   Lymphs Abs 0.3 (L) 1.0 - 3.6 K/uL   Monocytes Relative 5 %   Monocytes Absolute 0.4 0.2 - 1.0 K/uL   Eosinophils Relative 0 %   Eosinophils Absolute 0.0 0 - 0.7 K/uL   Basophils Relative 0 %   Basophils Absolute 0.0 0 - 0.1 K/uL  Protime-INR     Status: None   Collection Time: 06/16/17  5:48 AM  Result Value Ref Range   Prothrombin Time 14.7 11.4 - 15.2 seconds   INR 1.16    Dg Chest 2 View  Result Date: 06/15/2017 CLINICAL DATA:  Right shoulder pain beginning 3 days ago. Patient denies trauma. Fever for 3 days. EXAM: CHEST  2 VIEW COMPARISON:  None. FINDINGS: There is a somewhat tubular and platelike opacity in the right base. There appears to be streaky opacity in the left retrocardiac region. No pneumothorax. The heart, hila, mediastinum are normal. No nodules or masses. IMPRESSION: Bibasilar pulmonary opacities. The streaky and somewhat platelike nature suggests the possibility of atelectasis. However, given the history of fever, pneumonia is not excluded. Recommend follow-up to resolution. Electronically Signed   By: Gerome Samavid  Williams III M.D   On: 06/15/2017 07:49   Dg Shoulder Right  Result Date: 06/15/2017 CLINICAL DATA:  Pain without trauma EXAM: RIGHT SHOULDER - 2+ VIEW COMPARISON:  None. FINDINGS: There is no evidence of fracture or dislocation. There is no evidence of arthropathy or other focal bone abnormality. Soft tissues are unremarkable. IMPRESSION: Negative. Electronically Signed   By: Gerome Samavid  Williams III M.D   On: 06/15/2017 07:51   Ct Soft Tissue Neck W Contrast  Result Date: 06/15/2017 CLINICAL DATA:  46 year old male with fever, right neck and shoulder pain for 3 days. EXAM: CT NECK WITH CONTRAST TECHNIQUE: Multidetector CT imaging of the neck was performed using the standard protocol following the bolus administration of intravenous contrast. CONTRAST:  75mL ISOVUE-300 IOPAMIDOL (ISOVUE-300) INJECTION 61% COMPARISON:  Chest radiographs 0724  hours today. FINDINGS: Pharynx and larynx: Appears to be physiologic. Small volume retained secretions suspected in the vallecula and hypopharynx. No tonsillar enlargement or hyper enhancement. The epiglottis appears within normal limits. Negative parapharyngeal and retropharyngeal spaces. Salivary glands: Sublingual space, submandibular glands, and parotid glands are within normal limits. Thyroid: Negative. Lymph nodes: Appears symmetric and within normal limits. Vascular: Major vascular structures in the neck and at the skullbase appear patent and normal. Limited intracranial: Negative. Visualized orbits: Negative. Mastoids and visualized paranasal sinuses: Scattered bilateral ethmoid and sphenoid sinus opacification and mucosal thickening with some bubbly opacity. The maxillary sinuses are spared (Small left anterior maxillary mucous retention cyst). Tympanic cavities and mastoids are clear. Rightward nasal septal deviation and spurring. Trace bubbly opacity in the left nasal cavity. Skeleton: Bilateral C2-C3 posterior element ankylosis. Severe left C3-C4 facet arthropathy and bulky leftward endplate spurring. No acute osseous abnormality identified. Upper chest: Normal visualized superior mediastinum. The lung apices are clear. Visible axillary lymph nodes are normal. IMPRESSION: 1. No definite acute or inflammatory process in the neck. 2. Scattered ethmoid and sphenoid paranasal sinusitis. 3. 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 Electronically Signed   By: Odessa FlemingH  Hall M.D.   On: 06/15/2017 08:41    Review of Systems  Constitutional: Negative for chills and fever.  HENT: Negative for sore throat and tinnitus.   Eyes: Negative for blurred vision and redness.  Respiratory: Negative for cough and shortness of breath.   Cardiovascular: Negative for chest pain, palpitations, orthopnea and PND.  Gastrointestinal: Negative for abdominal  pain, diarrhea, nausea and vomiting.  Genitourinary: Negative for dysuria, frequency and urgency.  Musculoskeletal: Negative for joint pain and myalgias.  Skin: Negative for rash.       No lesions  Neurological: Negative for speech change, focal weakness and weakness.  Endo/Heme/Allergies: Does not bruise/bleed easily.       No temperature intolerance  Psychiatric/Behavioral: Negative for depression and suicidal ideas.    Blood pressure 136/83, pulse (!) 103, temperature 98.8 F (37.1 C), temperature source Oral, resp. rate (!) 26, height 5\' 8"  (1.727 m), weight 90.7 kg (200 lb), SpO2 97 %. Physical Exam  Vitals reviewed. Constitutional: He is oriented to person, place, and time. He appears well-developed and well-nourished. No distress.  HENT:  Head: Normocephalic and atraumatic.  Mouth/Throat: Oropharynx is clear and moist.  Eyes: Conjunctivae and EOM are normal. Pupils are equal, round, and reactive to light. No scleral icterus.  Neck: Normal range of motion. Neck supple. No JVD present. No tracheal deviation present. No thyromegaly present.  Cardiovascular: Regular rhythm and normal heart sounds. Tachycardia present. Exam reveals no gallop and no friction rub.  No murmur heard. Respiratory: Breath sounds normal. Tachypnea noted.  GI: Soft. Bowel sounds are normal. He exhibits no distension.  Genitourinary:  Genitourinary Comments: Deferred  Musculoskeletal: Normal range of motion. He exhibits no edema.  Lymphadenopathy:    He has no cervical adenopathy.  Neurological: He is alert and oriented to person, place, and time. He has normal reflexes. No cranial nerve deficit.  Skin: Skin is warm and dry. No rash noted. No erythema.  Psychiatric: He has a normal mood and affect. His behavior is normal. Judgment and thought content normal.     Assessment/Plan This is a 46 year old male admitted for sepsis. 1.  Sepsis: The patient meets criteria via tachycardia and tachypnea.  He also  has septicemia the source of which is likely his shoulder.  Etiology may be capsulitis or ruptured bursitis.  If pain and range of motion do not improve with antibiotics the patient may need an MRI and surgical consult.  Severe pain to be managed with iv morphine. 2.  Obesity: BMI is 30.2; encouraged healthy diet and exercise. 3.  DVT prophylaxis: Lovenox 4.  GI prophylaxis: None The patient is a full code.  Time spent on admission orders and patient care approximately 45 minutes  Arnaldo Natal, MD 06/16/2017, 6:22 AM

## 2017-06-16 NOTE — Progress Notes (Signed)
CODE SEPSIS - PHARMACY COMMUNICATION  **Broad Spectrum Antibiotics should be administered within 1 hour of Sepsis diagnosis**  Time Code Sepsis Called/Page Received: 12/16 0600  Antibiotics Ordered: Ancef 12/16 0549  Time of 1st antibiotic administration: 12/16 0603  Additional action taken by pharmacy: n/a  If necessary, Name of Provider/Nurse Contacted: n/a    Erich MontaneMcBane,Alaynna Kerwood S ,PharmD Clinical Pharmacist  06/16/2017  6:57 AM

## 2017-06-16 NOTE — ED Provider Notes (Signed)
[facility] Emergency Department Provider Note  ____________________________________________   None    (approximate)  I have reviewed the triage vital signs and the nursing notes.   HISTORY  Chief Complaint Fever    HPI Jeremy AmericanJoseph A [name] is a 46 y.o. male who self presents to the emergency department as a callback for 4-4 bottles methicillin sensitive Staphylococcus aureus.  He seen in the emergency department yesterday for fever and right shoulder pain and was diagnosed with a muscle strain.  After getting home his pain worsened and he was actually febrile to 102.5 degrees this morning.  His symptoms began roughly 5 days ago on Tuesday with gradual onset not maximal onset slowly progressive and now severe pain in his right shoulder.  He has had no surgery on his shoulder ever.  He has had no recent dental work.  His pain is worsened with movement and somewhat improved with rest.  Past Medical History:  Diagnosis Date  . SVT (supraventricular tachycardia) (HCC) 12/2014    Patient Active Problem List   Diagnosis Date Noted  . Sepsis (HCC) 06/16/2017    Past Surgical History:  Procedure Laterality Date  . ABLATION     SVT    Prior to Admission medications   Medication Sig Start Date End Date Taking? Authorizing Provider  cyclobenzaprine (FLEXERIL) 10 MG tablet Take 1 tablet (10 mg total) by mouth 2 (two) times daily as needed for muscle spasms. Do not drive while taking as can cause drowsiness 06/12/17  Yes Renford DillsMiller, Lindsey, NP  diazepam (VALIUM) 5 MG tablet Take 1 tablet (5 mg total) by mouth every 8 (eight) hours as needed for muscle spasms. 06/15/17  Yes Emily FilbertWilliams, Jonathan E, MD  ibuprofen (ADVIL,MOTRIN) 800 MG tablet Take 1 tablet (800 mg total) by mouth every 8 (eight) hours as needed. 06/15/17  Yes Emily FilbertWilliams, Jonathan E, MD  lovastatin (MEVACOR) 20 MG tablet Take 20 mg by mouth every evening. 02/27/16  Yes [provider]     Allergies Patient has no known allergies.  Family History  Problem Relation Age of Onset  . Cancer Father   . Diabetes Maternal Grandmother     Social History Social History   Tobacco Use  . Smoking status: Never Smoker  . Smokeless tobacco: Never Used  Substance Use Topics  . Alcohol use: Yes  . Drug use: Not on file    Review of Systems Constitutional: Positive for fevers Eyes: No visual changes. ENT: No sore throat. Cardiovascular: Denies chest pain. Respiratory: Denies shortness of breath. Gastrointestinal: No abdominal pain.  No nausea, no vomiting.  No diarrhea.  No constipation. Genitourinary: Negative for dysuria. Musculoskeletal: Negative for back pain. Skin: Negative for rash. Neurological: Negative for headaches, focal weakness or numbness.   ____________________________________________   PHYSICAL EXAM:  VITAL SIGNS: ED Triage Vitals  Enc Vitals Group     BP      Pulse      Resp      Temp      Temp src      SpO2      Weight      Height      Head Circumference      Peak Flow      Pain Score      Pain Loc      Pain Edu?      Excl. in GC?     Constitutional: Alert and oriented x4 appears uncomfortable splinting his right shoulder Eyes: PERRL EOMI.  Head: Atraumatic. Nose: No congestion/rhinnorhea. Mouth/Throat: No trismus Neck: No stridor.   Cardiovascular: Tachycardic rate, regular rhythm. Grossly normal heart sounds.  Good peripheral circulation. Respiratory: Normal respiratory effort.  No retractions. Lungs CTAB and moving good air Gastrointestinal: Soft nontender Musculoskeletal: Exquisite discomfort when ranging right shoulder Neurologic:  Normal speech and language. No gross focal neurologic deficits are appreciated. Skin:  Skin is warm, dry and intact. No rash noted. Psychiatric: Mood and affect are normal. Speech and behavior are normal.    ____________________________________________   DIFFERENTIAL includes but not  limited to  Bacteremia, sepsis, septic joint, endocarditis ____________________________________________   LABS (all labs ordered are listed, but only abnormal results are displayed)  Labs Reviewed  COMPREHENSIVE METABOLIC PANEL - Abnormal; Notable for the following components:      Result Value   Potassium 3.2 (*)    Glucose, Bld 130 (*)    Calcium 8.6 (*)    Albumin 3.4 (*)    AST 47 (*)    ALT 70 (*)    Total Bilirubin 1.7 (*)    All other components within normal limits  CBC WITH DIFFERENTIAL/PLATELET - Abnormal; Notable for the following components:   RBC 4.37 (*)    HCT 39.1 (*)    Neutro Abs 6.8 (*)    Lymphs Abs 0.3 (*)    All other components within normal limits  URINALYSIS, ROUTINE W REFLEX MICROSCOPIC - Abnormal; Notable for the following components:   Color, Urine AMBER (*)    APPearance HAZY (*)    Specific Gravity, Urine 1.031 (*)    Glucose, UA 50 (*)    Protein, ur 100 (*)    Squamous Epithelial / LPF 0-5 (*)    All other components within normal limits  LACTIC ACID, PLASMA - Abnormal; Notable for the following components:   Lactic Acid, Venous 2.6 (*)    All other components within normal limits  C-REACTIVE PROTEIN - Abnormal; Notable for the following components:   CRP 21.5 (*)    All other components within normal limits  SEDIMENTATION RATE - Abnormal; Notable for the following components:   Sed Rate 67 (*)    All other components within normal limits  URINE CULTURE  LACTIC ACID, PLASMA  LIPASE, BLOOD  TROPONIN I  PROCALCITONIN  PROTIME-INR  LACTIC ACID, PLASMA  LACTIC ACID, PLASMA  CBC  BASIC METABOLIC PANEL    Blood work reviewed by me shows elevated lactic acid level concerning for sepsis __________________________________________  EKG  ED ECG REPORT I, Merrily Brittle, the attending physician, personally viewed and interpreted this ECG.  Date: 06/16/2017 EKG Time:  Rate: 108 Rhythm: sinus tach QRS Axis: rightward Intervals:  normal ST/T Wave abnormalities: normal Narrative Interpretation: no evidence of acute ischemia  ____________________________________________  RADIOLOGY  Chest x-ray reviewed by me concerning for atelectasis versus pneumonia ____________________________________________   PROCEDURES  Procedure(s) performed: no  .Critical Care Performed by: Merrily Brittle, MD Authorized by: Merrily Brittle, MD   Critical care provider statement:    Critical care time (minutes):  35   Critical care time was exclusive of:  Separately billable procedures and treating other patients   Critical care was necessary to treat or prevent imminent or life-threatening deterioration of the following conditions:  Sepsis   Critical care was time spent personally by me on the following activities:  Development of treatment plan with patient or surrogate, discussions with consultants, evaluation of patient's response to treatment, examination of patient, ordering and review of laboratory studies, ordering  and review of radiographic studies, pulse oximetry and re-evaluation of patient's condition     Critical Care performed: yes  Observation: no ____________________________________________   INITIAL IMPRESSION / ASSESSMENT AND PLAN / ED COURSE  Pertinent labs & imaging results that were available during my care of the patient were reviewed by me and considered in my medical decision making (see chart for details).  On arrival the patient is uncomfortable appearing febrile tachycardic with 4-4 blood cultures which have been positive.  This is methicillin sensitive Staphylococcus aureus so we will treat him with Ancef now in addition to aggressive IV fluids.  The patient requires inpatient admission.  I discussed with the hospitalist Dr. Sheryle Haildiamond who is graciously agreed to admit the patient to his service.      ____________________________________________   FINAL CLINICAL IMPRESSION(S) / ED  DIAGNOSES  Final diagnoses:  Bacteremia  Sepsis due to methicillin susceptible Staphylococcus aureus (HCC)      NEW MEDICATIONS STARTED DURING THIS VISIT:  This SmartLink is deprecated. Use AVSMEDLIST instead to display the medication list for a patient.   Note:  This document was prepared using Dragon voice recognition software and may include unintentional dictation errors.     Merrily Brittleifenbark, Cassondra Stachowski, MD 06/16/17 2236

## 2017-06-16 NOTE — Progress Notes (Signed)
*  PRELIMINARY RESULTS* Echocardiogram 2D Echocardiogram has been performed.  Jeremy Humphrey 06/16/2017, 12:10 PM

## 2017-06-16 NOTE — Progress Notes (Signed)
Pharmacy Antibiotic Note  Jeremy AmericanJoseph A Herrmann Jr. is a 46 y.o. male admitted on 06/16/2017 with bacteremia.  Pharmacy has been consulted for cefazolin dosing.  Plan: Cefazolin 2 g iv q 8 hours.   Height: 5\' 8"  (172.7 cm) Weight: 200 lb (90.7 kg) IBW/kg (Calculated) : 68.4  Temp (24hrs), Avg:99.3 F (37.4 C), Min:98.8 F (37.1 C), Max:99.7 F (37.6 C)  Recent Labs  Lab 06/15/17 0651 06/16/17 0548  WBC 8.5 7.5  CREATININE 1.08 1.13  LATICACIDVEN 1.3 1.6    Estimated Creatinine Clearance: 87.4 mL/min (by C-G formula based on SCr of 1.13 mg/dL).    No Known Allergies  Antimicrobials this admission: Cefazolin 12/16 >>   Dose adjustments this admission:  Microbiology results: UCx: sent   Thank you for allowing pharmacy to be a part of this patient's care.  Luisa HartChristy, Orpha Dain D 06/16/2017 7:29 AM

## 2017-06-16 NOTE — ED Notes (Signed)
Pt states had a fever at home of over 102 and took 800mg  ibuprofen pta.

## 2017-06-16 NOTE — ED Notes (Signed)
Pt updated on admission process.  

## 2017-06-16 NOTE — Progress Notes (Signed)
Attending MD Juliene Pina(Mody) called and notified of patient remaining febrile despite tylenol x2, ibuprofen x1, and cooling blanket in place.   Obtained order for scheduled ibuprofen. Order placed.   Cooling blanket remains in place. Will continue to monitor patient. Bo McclintockBrewer,Amyr Sluder S, RN

## 2017-06-16 NOTE — ED Notes (Signed)
Po fluids provided with md consent.  

## 2017-06-16 NOTE — Progress Notes (Signed)
Patient febrile: 102.7 orally. Attending MD Juliene Pina(Mody) paged and notified. No further intervention/no new orders at this time. Continue to monitor closely. Bo McclintockBrewer,Marvyn Torrez S, RN

## 2017-06-16 NOTE — Consult Note (Signed)
ORTHOPAEDIC CONSULTATION  REQUESTING PHYSICIAN: Adrian Saran, MD  Chief Complaint: Right shoulder pain  HPI: Jeremy Humphrey. is a 46 y.o. male who complains of  right shoulder pain since Wednesday. Patient states he has experienced pain extending into the trapezius and down the anterior chest which is a been exacerbated with overhead or abduction activity. Patient initially associated this with shoveling on Monday and Tuesday. However he has developed fevers beginning on Thursday and has MSSA bacteremia 4/4 bottles upon admission to the [facility]. I been consulted to evaluate the patient's shoulder pain and help determine if this is a right septic shoulder joint. Patient denies any recent illnesses.  Past Medical History:  Diagnosis Date  . SVT (supraventricular tachycardia) (HCC) 12/2014   Past Surgical History:  Procedure Laterality Date  . ABLATION     SVT   Social History   Socioeconomic History  . Marital status: Single    Spouse name: None  . Number of children: None  . Years of education: None  . Highest education level: None  Social Needs  . Financial resource strain: None  . Food insecurity - worry: None  . Food insecurity - inability: None  . Transportation needs - medical: None  . Transportation needs - non-medical: None  Occupational History  . None  Tobacco Use  . Smoking status: Never Smoker  . Smokeless tobacco: Never Used  Substance and Sexual Activity  . Alcohol use: Yes  . Drug use: None  . Sexual activity: None  Other Topics Concern  . None  Social History Narrative  . None   Family History  Problem Relation Age of Onset  . Cancer Father   . Diabetes Maternal Grandmother    No Known Allergies Prior to Admission medications   Medication Sig Start Date End Date Taking? Authorizing Provider  cyclobenzaprine (FLEXERIL) 10 MG tablet Take 1 tablet (10 mg total) by mouth 2 (two) times daily as needed for muscle spasms. Do not  drive while taking as can cause drowsiness 06/12/17  Yes Renford Dills, NP  diazepam (VALIUM) 5 MG tablet Take 1 tablet (5 mg total) by mouth every 8 (eight) hours as needed for muscle spasms. 06/15/17  Yes Emily Filbert, MD  ibuprofen (ADVIL,MOTRIN) 800 MG tablet Take 1 tablet (800 mg total) by mouth every 8 (eight) hours as needed. 06/15/17  Yes Emily Filbert, MD  lovastatin (MEVACOR) 20 MG tablet Take 20 mg by mouth every evening. 02/27/16  Yes [provider]   Dg Chest 2 View  Result Date: 06/15/2017 CLINICAL DATA:  Right shoulder pain beginning 3 days ago. Patient denies trauma. Fever for 3 days. EXAM: CHEST  2 VIEW COMPARISON:  None. FINDINGS: There is a somewhat tubular and platelike opacity in the right base. There appears to be streaky opacity in the left retrocardiac region. No pneumothorax. The heart, hila, mediastinum are normal. No nodules or masses. IMPRESSION: Bibasilar pulmonary opacities. The streaky and somewhat platelike nature suggests the possibility of atelectasis. However, given the history of fever, pneumonia is not excluded. Recommend follow-up to resolution. Electronically Signed   By: Gerome Sam III M.D   On: 06/15/2017 07:49   Dg Shoulder Right  Result Date: 06/15/2017 CLINICAL DATA:  Pain without trauma EXAM: RIGHT SHOULDER - 2+ VIEW COMPARISON:  None. FINDINGS: There is no evidence of fracture or dislocation. There is no evidence of arthropathy or other focal bone abnormality. Soft tissues are unremarkable. IMPRESSION: Negative. Electronically Signed  By: Gerome Samavid  Williams III M.D   On: 06/15/2017 07:51   Ct Soft Tissue Neck W Contrast  Result Date: 06/15/2017 CLINICAL DATA:  46 year old male with fever, right neck and shoulder pain for 3 days. EXAM: CT NECK WITH CONTRAST TECHNIQUE: Multidetector CT imaging of the neck was performed using the standard protocol following the bolus administration of intravenous contrast. CONTRAST:  75mL  ISOVUE-300 IOPAMIDOL (ISOVUE-300) INJECTION 61% COMPARISON:  Chest radiographs 0724 hours today. FINDINGS: Pharynx and larynx: Appears to be physiologic. Small volume retained secretions suspected in the vallecula and hypopharynx. No tonsillar enlargement or hyper enhancement. The epiglottis appears within normal limits. Negative parapharyngeal and retropharyngeal spaces. Salivary glands: Sublingual space, submandibular glands, and parotid glands are within normal limits. Thyroid: Negative. Lymph nodes: Appears symmetric and within normal limits. Vascular: Major vascular structures in the neck and at the skullbase appear patent and normal. Limited intracranial: Negative. Visualized orbits: Negative. Mastoids and visualized paranasal sinuses: Scattered bilateral ethmoid and sphenoid sinus opacification and mucosal thickening with some bubbly opacity. The maxillary sinuses are spared (Small left anterior maxillary mucous retention cyst). Tympanic cavities and mastoids are clear. Rightward nasal septal deviation and spurring. Trace bubbly opacity in the left nasal cavity. Skeleton: Bilateral C2-C3 posterior element ankylosis. Severe left C3-C4 facet arthropathy and bulky leftward endplate spurring. No acute osseous abnormality identified. Upper chest: Normal visualized superior mediastinum. The lung apices are clear. Visible axillary lymph nodes are normal. IMPRESSION: 1. No definite acute or inflammatory process in the neck. 2. Scattered ethmoid and sphenoid paranasal sinusitis. 3. 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 Electronically Signed   By: Odessa FlemingH  Hall M.D.   On: 06/15/2017 08:41   Dg Chest Port 1 View  Result Date: 06/16/2017 CLINICAL DATA:  Code sepsis. EXAM: PORTABLE CHEST 1 VIEW COMPARISON:  Chest radiograph performed 06/15/2017 FINDINGS: Persistent right midlung linear opacity is noted. This could reflect pneumonia. Retrocardiac  opacity has improved. No pleural effusion or pneumothorax is seen. The cardiomediastinal silhouette is normal in size. No acute osseous abnormalities are identified. IMPRESSION: Persistent right midlung linear opacity could reflect pneumonia. Retrocardiac opacity is improved. Followup PA and lateral chest X-ray is recommended in 3-4 weeks following trial of antibiotic therapy to ensure resolution and exclude underlying malignancy. Electronically Signed   By: Roanna RaiderJeffery  Chang M.D.   On: 06/16/2017 06:40    Positive ROS: All other systems have been reviewed and were otherwise negative with the exception of those mentioned in the HPI and as above.  Physical Exam: General: Alert, no acute distress  MUSCULOSKELETAL: Right shoulder: Patient's skin is intact. There is no ecchymosis and no significant erythema or swelling. There is no deformity. Patient can forward elevate to approximately 140 and abduct 120 on exam without significant pain currently. He has full digital wrist and elbow range of motion and forearm rotation. He has intact sensation to light touch throughout the right upper extremity. He has a palpable radial pulse. He has no detectable weakness to rotator cuff strength to manual strength testing.  He has positive impingement signs but no apprehension or instability. He did not have significant tenderness on palpation of his right shoulder trapezius or pectoralis muscle.  Assessment: Right shoulder pain secondary to bursitis/tendinitis versus septic shoulder joint.  Plan: I have discussed this case with Dr. Juliene PinaMody.  Patient is scheduled for an MRI. I've ordered this with and without contrast after conferring with the radiologist. A fluoroscopic aspiration of the right  shoulder joint is ordered pending the results of the MRI. Patient has had improvement in his pain with narcotic and NSAIDs.  His echo was negative for valvular vegetation.  Patient does not show bacteria on his UA.  Source of the  bacteremia is currently unknown.  It is unlikely that the right shoulder is the primary source of his bacteremia but may have been seeded by the bacteremia. I will review the MRI results once available to determine whether the patient requires an aspiration.  Continue current antibiotics and pain management.    Juanell Fairly, MD    06/16/2017 2:20 PM

## 2017-06-16 NOTE — Progress Notes (Addendum)
MRI not available at [facility] at this time.  I checked with radiology department.  Will get exam tomorrow.   I will review once results are available.  I am ordering an ESR and CRP as well.

## 2017-06-16 NOTE — ED Triage Notes (Signed)
Pt arrives after being called at home for positive blood cultures. Pt with right shoulder pain.

## 2017-06-16 NOTE — Progress Notes (Signed)
ID E note Notified of positive MSSA blood culture.  Admitted with fevers and R shoulder pain.   He has been started on cefazolin and fu bcx done 12/15 pending. Seen by ortho and for MRI shoulder and possible aspiration. Given the history I certainly expect the shoulder to be infected.  He will need minimum 2 weeks IV abx. Would rec a TEE but would work on his shoulder first and may need surgery.  I will see tomorrow.

## 2017-06-17 ENCOUNTER — Inpatient Hospital Stay: Payer: BLUE CROSS/BLUE SHIELD

## 2017-06-17 LAB — BASIC METABOLIC PANEL
Anion gap: 6 (ref 5–15)
BUN: 12 mg/dL (ref 6–20)
CHLORIDE: 109 mmol/L (ref 101–111)
CO2: 24 mmol/L (ref 22–32)
CREATININE: 0.89 mg/dL (ref 0.61–1.24)
Calcium: 7.5 mg/dL — ABNORMAL LOW (ref 8.9–10.3)
GFR calc Af Amer: >60 mL/min (ref >60)
GFR calc non Af Amer: >60 mL/min (ref >60)
GLUCOSE: 116 mg/dL — AB (ref 65–99)
Potassium: 3.4 mmol/L — ABNORMAL LOW (ref 3.5–5.1)
Sodium: 139 mmol/L (ref 135–145)

## 2017-06-17 LAB — CBC
HEMATOCRIT: 34.8 % — AB (ref 40.0–52.0)
HEMOGLOBIN: 11.9 g/dL — AB (ref 13.0–18.0)
MCH: 30.7 pg (ref 26.0–34.0)
MCHC: 34.3 g/dL (ref 32.0–36.0)
MCV: 89.6 fL (ref 80.0–100.0)
Platelets: 145 10*3/uL — ABNORMAL LOW (ref 150–440)
RBC: 3.88 MIL/uL — ABNORMAL LOW (ref 4.40–5.90)
RDW: 13 % (ref 11.5–14.5)
WBC: 4.6 10*3/uL (ref 3.8–10.6)

## 2017-06-17 LAB — URINE CULTURE: Culture: NO GROWTH

## 2017-06-17 MED ORDER — GADOBENATE DIMEGLUMINE 529 MG/ML IV SOLN
20.0000 mL | Freq: Once | INTRAVENOUS | Status: AC | PRN
  Administered 2017-06-17: 19 mL via INTRAVENOUS

## 2017-06-17 NOTE — Consult Note (Signed)
[facility] Infectious Disease     Reason for Consult  MSSA bacteremia   Referring Physician: Ulysees Barns Date of Admission:  06/16/2017   Active Problems:   Sepsis ([place])   HPI: Jeremy Humphrey. is a 46 y.o. male admitted with fevers and R shoulder pain. Pain began last wed after shoveling snow, and bowling. Fevers began Friday. On admit wbc 8, developed fevers to 103.  He has a cut on his scalp from 2 weeks ago but did not appear infected to him. He has no other skin or soft tissue infections. Has seen ortho, had MRI shoulder and sternum with small tear medial pec.  His shoulder is much better today. Has some R sided neck pain too. Some diff swallowing. Mild cough.  Works in [employer], wife is Therapist, sports in ED.   Past Medical History:  Diagnosis Date  . SVT (supraventricular tachycardia) ([place]) 12/2014   Past Surgical History:  Procedure Laterality Date  . ABLATION     SVT   Social History   Tobacco Use  . Smoking status: Never Smoker  . Smokeless tobacco: Never Used  Substance Use Topics  . Alcohol use: Yes  . Drug use: Not on file   Family History  Problem Relation Age of Onset  . Cancer Father   . Diabetes Maternal Grandmother     Allergies: No Known Allergies  Current antibiotics: Antibiotics Given (last 72 hours)    Date/Time Action Medication Dose Rate   06/16/17 0603 New Bag/Given   ceFAZolin (ANCEF) 2 g in dextrose 5 % 100 mL IVPB 2 g 200 mL/hr   06/16/17 1521 New Bag/Given   ceFAZolin (ANCEF) 2 g in dextrose 5 % 100 mL IVPB 2 g 200 mL/hr   06/16/17 2128 New Bag/Given   ceFAZolin (ANCEF) 2 g in dextrose 5 % 100 mL IVPB 2 g 200 mL/hr   06/17/17 0530 New Bag/Given   ceFAZolin (ANCEF) 2 g in dextrose 5 % 100 mL IVPB 2 g 200 mL/hr      MEDICATIONS: . docusate sodium  100 mg Oral BID  . enoxaparin (LOVENOX) injection  40 mg Subcutaneous Q24H  . pravastatin  20 mg Oral q1800    Review of Systems - 11 systems reviewed and negative per  HPI   OBJECTIVE: Temp:  [97.8 F (36.6 C)-102.7 F (39.3 C)] 97.8 F (36.6 C) (12/17 0406) Pulse Rate:  [82-90] 82 (12/17 0406) Resp:  [18-19] 18 (12/17 0406) BP: (122-126)/(74-77) 122/77 (12/17 0406) SpO2:  [90 %-91 %] 90 % (12/17 0406) Weight:  [91.3 kg (201 lb 3.2 oz)] 91.3 kg (201 lb 3.2 oz) (12/17 0406) Physical Exam  Constitutional: He is oriented to person, place, and time. He appears well-developed and well-nourished. No distress.  HENT: anicteric Mouth/Throat: Oropharynx is clear and moist. No oropharyngeal exudate.  Cardiovascular: Normal rate, regular rhythm and normal heart sounds.  Pulmonary/Chest:  bil rhonchi Abdominal: Soft. Bowel sounds are normal. He exhibits no distension. There is no tenderness.  Lymphadenopathy: He has no cervical adenopathy.  Neurological: He is alert and oriented to person, place, and time.  Skin: Skin is warm and dry. L scalp with a 1 cm wound, no redness Psychiatric: He has a normal mood and affect. His behavior is normal.     LABS: Results for orders placed or performed during the hospital encounter of 06/16/17 (from the past 48 hour(s))  Lactic acid, plasma     Status: None   Collection Time: 06/16/17  5:48  AM  Result Value Ref Range   Lactic Acid, Venous 1.6 0.5 - 1.9 mmol/L  Comprehensive metabolic panel     Status: Abnormal   Collection Time: 06/16/17  5:48 AM  Result Value Ref Range   Sodium 136 135 - 145 mmol/L   Potassium 3.2 (L) 3.5 - 5.1 mmol/L   Chloride 102 101 - 111 mmol/L   CO2 25 22 - 32 mmol/L   Glucose, Bld 130 (H) 65 - 99 mg/dL   BUN 14 6 - 20 mg/dL   Creatinine, Ser 1.13 0.61 - 1.24 mg/dL   Calcium 8.6 (L) 8.9 - 10.3 mg/dL   Total Protein 7.4 6.5 - 8.1 g/dL   Albumin 3.4 (L) 3.5 - 5.0 g/dL   AST 47 (H) 15 - 41 U/L   ALT 70 (H) 17 - 63 U/L   Alkaline Phosphatase 63 38 - 126 U/L   Total Bilirubin 1.7 (H) 0.3 - 1.2 mg/dL   GFR calc non Af Amer >60 >60 mL/min   GFR calc Af Amer >60 >60 mL/min    Comment:  (NOTE) The eGFR has been calculated using the CKD EPI equation. This calculation has not been validated in all clinical situations. eGFR's persistently <60 mL/min signify possible Chronic Kidney Disease.    Anion gap 9 5 - 15  Lipase, blood     Status: None   Collection Time: 06/16/17  5:48 AM  Result Value Ref Range   Lipase 18 11 - 51 U/L  Troponin I     Status: None   Collection Time: 06/16/17  5:48 AM  Result Value Ref Range   Troponin I <0.03 <0.03 ng/mL  CBC WITH DIFFERENTIAL     Status: Abnormal   Collection Time: 06/16/17  5:48 AM  Result Value Ref Range   WBC 7.5 3.8 - 10.6 K/uL   RBC 4.37 (L) 4.40 - 5.90 MIL/uL   Hemoglobin 13.4 13.0 - 18.0 g/dL   HCT 39.1 (L) 40.0 - 52.0 %   MCV 89.4 80.0 - 100.0 fL   MCH 30.6 26.0 - 34.0 pg   MCHC 34.3 32.0 - 36.0 g/dL   RDW 12.6 11.5 - 14.5 %   Platelets 194 150 - 440 K/uL   Neutrophils Relative % 91 %   Neutro Abs 6.8 (H) 1.4 - 6.5 K/uL   Lymphocytes Relative 4 %   Lymphs Abs 0.3 (L) 1.0 - 3.6 K/uL   Monocytes Relative 5 %   Monocytes Absolute 0.4 0.2 - 1.0 K/uL   Eosinophils Relative 0 %   Eosinophils Absolute 0.0 0 - 0.7 K/uL   Basophils Relative 0 %   Basophils Absolute 0.0 0 - 0.1 K/uL  Procalcitonin     Status: None   Collection Time: 06/16/17  5:48 AM  Result Value Ref Range   Procalcitonin 0.76 ng/mL    Comment:        Interpretation: PCT > 0.5 ng/mL and <= 2 ng/mL: Systemic infection (sepsis) is possible, but other conditions are known to elevate PCT as well. (NOTE)       Sepsis PCT Algorithm           Lower Respiratory Tract                                      Infection PCT Algorithm    ----------------------------     ----------------------------  PCT < 0.25 ng/mL                PCT < 0.10 ng/mL         Strongly encourage             Strongly discourage   discontinuation of antibiotics    initiation of antibiotics    ----------------------------     -----------------------------       PCT 0.25 -  0.50 ng/mL            PCT 0.10 - 0.25 ng/mL               OR       >80% decrease in PCT            Discourage initiation of                                            antibiotics      Encourage discontinuation           of antibiotics    ----------------------------     -----------------------------         PCT >= 0.50 ng/mL              PCT 0.26 - 0.50 ng/mL                AND       <80% decrease in PCT             Encourage initiation of                                             antibiotics       Encourage continuation           of antibiotics    ----------------------------     -----------------------------        PCT >= 0.50 ng/mL                  PCT > 0.50 ng/mL               AND         increase in PCT                  Strongly encourage                                      initiation of antibiotics    Strongly encourage escalation           of antibiotics                                     -----------------------------                                           PCT <= 0.25 ng/mL  OR                                        > 80% decrease in PCT                                     Discontinue / Do not initiate                                             antibiotics   Protime-INR     Status: None   Collection Time: 06/16/17  5:48 AM  Result Value Ref Range   Prothrombin Time 14.7 11.4 - 15.2 seconds   INR 1.16   Lactic acid, plasma     Status: Abnormal   Collection Time: 06/16/17  9:02 AM  Result Value Ref Range   Lactic Acid, Venous 2.6 (HH) 0.5 - 1.9 mmol/L    Comment: CRITICAL RESULT CALLED TO, READ BACK BY AND VERIFIED WITH SHEA BREWER 06/16/17 @ 0945  MLK   Urinalysis, Routine w reflex microscopic     Status: Abnormal   Collection Time: 06/16/17 12:49 PM  Result Value Ref Range   Color, Urine AMBER (A) YELLOW    Comment: BIOCHEMICALS MAY BE AFFECTED BY COLOR   APPearance HAZY (A) CLEAR   Specific Gravity, Urine  1.031 (H) 1.005 - 1.030   pH 5.0 5.0 - 8.0   Glucose, UA 50 (A) NEGATIVE mg/dL   Hgb urine dipstick NEGATIVE NEGATIVE   Bilirubin Urine NEGATIVE NEGATIVE   Ketones, ur NEGATIVE NEGATIVE mg/dL   Protein, ur 100 (A) NEGATIVE mg/dL   Nitrite NEGATIVE NEGATIVE   Leukocytes, UA NEGATIVE NEGATIVE   RBC / HPF 0-5 0 - 5 RBC/hpf   WBC, UA 0-5 0 - 5 WBC/hpf   Bacteria, UA NONE SEEN NONE SEEN   Squamous Epithelial / LPF 0-5 (A) NONE SEEN   Mucus PRESENT    Amorphous Crystal PRESENT   Lactic acid, plasma     Status: None   Collection Time: 06/16/17  1:24 PM  Result Value Ref Range   Lactic Acid, Venous 1.6 0.5 - 1.9 mmol/L  Lactic acid, plasma     Status: None   Collection Time: 06/16/17  3:21 PM  Result Value Ref Range   Lactic Acid, Venous 1.4 0.5 - 1.9 mmol/L  C-reactive protein     Status: Abnormal   Collection Time: 06/16/17  3:21 PM  Result Value Ref Range   CRP 21.5 (H) <1.0 mg/dL    Comment: Performed at [facility], [address] [place], [place] [address]  Sedimentation rate     Status: Abnormal   Collection Time: 06/16/17  3:21 PM  Result Value Ref Range   Sed Rate 67 (H) 0 - 15 mm/hr  CBC     Status: Abnormal   Collection Time: 06/17/17  4:29 AM  Result Value Ref Range   WBC 4.6 3.8 - 10.6 K/uL   RBC 3.88 (L) 4.40 - 5.90 MIL/uL   Hemoglobin 11.9 (L) 13.0 - 18.0 g/dL   HCT 34.8 (L) 40.0 - 52.0 %   MCV 89.6 80.0 - 100.0 fL   MCH 30.7 26.0 - 34.0 pg  MCHC 34.3 32.0 - 36.0 g/dL   RDW 13.0 11.5 - 14.5 %   Platelets 145 (L) 150 - 440 K/uL  Basic metabolic panel     Status: Abnormal   Collection Time: 06/17/17  4:29 AM  Result Value Ref Range   Sodium 139 135 - 145 mmol/L   Potassium 3.4 (L) 3.5 - 5.1 mmol/L   Chloride 109 101 - 111 mmol/L   CO2 24 22 - 32 mmol/L   Glucose, Bld 116 (H) 65 - 99 mg/dL   BUN 12 6 - 20 mg/dL   Creatinine, Ser 0.89 0.61 - 1.24 mg/dL   Calcium 7.5 (L) 8.9 - 10.3 mg/dL   GFR calc non Af Amer >60 >60 mL/min   GFR calc Af Amer >60  >60 mL/min    Comment: (NOTE) The eGFR has been calculated using the CKD EPI equation. This calculation has not been validated in all clinical situations. eGFR's persistently <60 mL/min signify possible Chronic Kidney Disease.    Anion gap 6 5 - 15   No components found for: ESR, C REACTIVE PROTEIN MICRO: Recent Results (from the past 720 hour(s))  Blood culture (routine x 2)     Status: Abnormal (Preliminary result)   Collection Time: 06/15/17  7:05 AM  Result Value Ref Range Status   Specimen Description BLOOD LT FOREARM  Final   Special Requests   Final    BOTTLES DRAWN AEROBIC AND ANAEROBIC Blood Culture results may not be optimal due to an excessive volume of blood received in culture bottles   Culture  Setup Time   Final    GRAM POSITIVE COCCI IN BOTH AEROBIC AND ANAEROBIC BOTTLES CRITICAL RESULT CALLED TO, READ BACK BY AND VERIFIED WITH: MATT MCBANE AT 2220 ON 06/15/17 RWW    Culture STAPHYLOCOCCUS AUREUS (A)  Final   Report Status PENDING  Incomplete  Blood culture (routine x 2)     Status: Abnormal (Preliminary result)   Collection Time: 06/15/17  7:05 AM  Result Value Ref Range Status   Specimen Description BLOOD LT UPPER ARM  Final   Special Requests   Final    BOTTLES DRAWN AEROBIC AND ANAEROBIC Blood Culture results may not be optimal due to an excessive volume of blood received in culture bottles   Culture  Setup Time   Final    GRAM POSITIVE COCCI IN BOTH AEROBIC AND ANAEROBIC BOTTLES CRITICAL RESULT CALLED TO, READ BACK BY AND VERIFIED WITH: MATT MCBANE AT 2220 ON 06/15/17 RWW    Culture (A)  Final    STAPHYLOCOCCUS AUREUS SUSCEPTIBILITIES TO FOLLOW Performed at [facility], [place] [address], [place], [place] [address]    Report Status PENDING  Incomplete  Blood Culture ID Panel (Reflexed)     Status: Abnormal   Collection Time: 06/15/17  7:05 AM  Result Value Ref Range Status   Enterococcus species NOT DETECTED NOT DETECTED Final   Listeria  monocytogenes NOT DETECTED NOT DETECTED Final   Staphylococcus species DETECTED (A) NOT DETECTED Final    Comment: CRITICAL RESULT CALLED TO, READ BACK BY AND VERIFIED WITH: MATT MCBANE AT 2220 ON 06/15/17 RWW    Staphylococcus aureus DETECTED (A) NOT DETECTED Final    Comment: Methicillin (oxacillin) susceptible Staphylococcus aureus (MSSA). Preferred therapy is anti staphylococcal beta lactam antibiotic (Cefazolin or Nafcillin), unless clinically contraindicated. CRITICAL RESULT CALLED TO, READ BACK BY AND VERIFIED WITH: MATT MCBANE AT 2220 ON 06/15/17/RWW    Methicillin resistance NOT DETECTED NOT DETECTED Final  Streptococcus species NOT DETECTED NOT DETECTED Final   Streptococcus agalactiae NOT DETECTED NOT DETECTED Final   Streptococcus pneumoniae NOT DETECTED NOT DETECTED Final   Streptococcus pyogenes NOT DETECTED NOT DETECTED Final   Acinetobacter baumannii NOT DETECTED NOT DETECTED Final   Enterobacteriaceae species NOT DETECTED NOT DETECTED Final   Enterobacter cloacae complex NOT DETECTED NOT DETECTED Final   Escherichia coli NOT DETECTED NOT DETECTED Final   Klebsiella oxytoca NOT DETECTED NOT DETECTED Final   Klebsiella pneumoniae NOT DETECTED NOT DETECTED Final   Proteus species NOT DETECTED NOT DETECTED Final   Serratia marcescens NOT DETECTED NOT DETECTED Final   Haemophilus influenzae NOT DETECTED NOT DETECTED Final   Neisseria meningitidis NOT DETECTED NOT DETECTED Final   Pseudomonas aeruginosa NOT DETECTED NOT DETECTED Final   Candida albicans NOT DETECTED NOT DETECTED Final   Candida glabrata NOT DETECTED NOT DETECTED Final   Candida krusei NOT DETECTED NOT DETECTED Final   Candida parapsilosis NOT DETECTED NOT DETECTED Final   Candida tropicalis NOT DETECTED NOT DETECTED Final    IMAGING: Dg Chest 2 View  Result Date: 06/15/2017 CLINICAL DATA:  Right shoulder pain beginning 3 days ago. Patient denies trauma. Fever for 3 days. EXAM: CHEST  2 VIEW  COMPARISON:  None. FINDINGS: There is a somewhat tubular and platelike opacity in the right base. There appears to be streaky opacity in the left retrocardiac region. No pneumothorax. The heart, hila, mediastinum are normal. No nodules or masses. IMPRESSION: Bibasilar pulmonary opacities. The streaky and somewhat platelike nature suggests the possibility of atelectasis. However, given the history of fever, pneumonia is not excluded. Recommend follow-up to resolution. Electronically Signed   By: Dorise Bullion III M.D   On: 06/15/2017 07:49   Dg Shoulder Right  Result Date: 06/15/2017 CLINICAL DATA:  Pain without trauma EXAM: RIGHT SHOULDER - 2+ VIEW COMPARISON:  None. FINDINGS: There is no evidence of fracture or dislocation. There is no evidence of arthropathy or other focal bone abnormality. Soft tissues are unremarkable. IMPRESSION: Negative. Electronically Signed   By: Dorise Bullion III M.D   On: 06/15/2017 07:51   Ct Soft Tissue Neck W Contrast  Result Date: 06/15/2017 CLINICAL DATA:  46 year old male with fever, right neck and shoulder pain for 3 days. EXAM: CT NECK WITH CONTRAST TECHNIQUE: Multidetector CT imaging of the neck was performed using the standard protocol following the bolus administration of intravenous contrast. CONTRAST:  6m ISOVUE-300 IOPAMIDOL (ISOVUE-300) INJECTION 61% COMPARISON:  Chest radiographs 0724 hours today. FINDINGS: Pharynx and larynx: Appears to be physiologic. Small volume retained secretions suspected in the vallecula and hypopharynx. No tonsillar enlargement or hyper enhancement. The epiglottis appears within normal limits. Negative parapharyngeal and retropharyngeal spaces. Salivary glands: Sublingual space, submandibular glands, and parotid glands are within normal limits. Thyroid: Negative. Lymph nodes: Appears symmetric and within normal limits. Vascular: Major vascular structures in the neck and at the skullbase appear patent and normal. Limited  intracranial: Negative. Visualized orbits: Negative. Mastoids and visualized paranasal sinuses: Scattered bilateral ethmoid and sphenoid sinus opacification and mucosal thickening with some bubbly opacity. The maxillary sinuses are spared (Small left anterior maxillary mucous retention cyst). Tympanic cavities and mastoids are clear. Rightward nasal septal deviation and spurring. Trace bubbly opacity in the left nasal cavity. Skeleton: Bilateral C2-C3 posterior element ankylosis. Severe left C3-C4 facet arthropathy and bulky leftward endplate spurring. No acute osseous abnormality identified. Upper chest: Normal visualized superior mediastinum. The lung apices are clear. Visible axillary lymph nodes are normal. IMPRESSION: 1.  No definite acute or inflammatory process in the neck. 2. Scattered ethmoid and sphenoid paranasal sinusitis. 3. 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 Electronically Signed   By: Genevie Ann M.D.   On: 06/15/2017 08:41   Mr [name] TS Contrast  Result Date: 06/17/2017 CLINICAL DATA:  Right-sided chest pain possibly associated with shoveling snow. Patient also has fevers. EXAM: MR STERNUM WITHOUT AND WITH CONTRAST TECHNIQUE: Multiplanar multisequence imaging of the chest wall was performed pre and post administration of IV contrast. CONTRAST:  19 cc MultiHance COMPARISON:  None. FINDINGS: Extensive edema like signal abnormality and enhancement in the right pectoralis major muscle medially. There is also fluid and inflammation in the subcutaneous soft tissues overlying the muscle and between the 2 pectoralis muscles in the midline. Findings are most consistent with a muscle strain and partial tearing. No avulsion from the sternal attachment. The sternum and sternoclavicular joints appear normal. No findings to suggest septic arthritis or osteomyelitis. IMPRESSION: 1. Right pectoralis major muscle tear medially. 2. No  findings to suggest septic arthritis or osteomyelitis involving the sternum or sternoclavicular joints. Electronically Signed   By: Marijo Sanes M.D.   On: 06/17/2017 11:43   Mr Shoulder Right W Wo Contrast  Result Date: 06/17/2017 CLINICAL DATA:  Right shoulder pain with fever. EXAM: MRI OF THE RIGHT SHOULDER WITHOUT AND WITH CONTRAST TECHNIQUE: Multiplanar, multisequence MR imaging of the right shoulder was performed before and after the administration of intravenous contrast. CONTRAST:  19 cc MultiHance COMPARISON:  Radiographs 06/15/2017 FINDINGS: Examination is somewhat limited by patient motion. Rotator cuff: Mild to moderate rotator cuff tendinopathy/tendinosis but no definite partial or full-thickness rotator cuff tear. Muscles:  No significant findings.  No muscle tear or myositis. Biceps long head:  Intact Acromioclavicular Joint: Mild degenerative changes. Type to acromion. No significant lateral downsloping or undersurface spurring. Glenohumeral Joint: No significant degenerative changes. No joint effusion. No synovitis or abnormal enhancement to suggest septic arthritis. Labrum:  No obvious labral tears. Bones:  No acute bony findings. Other: Mild subacromial/subdeltoid bursitis. IMPRESSION: 1. Mild to moderate rotator cuff tendinopathy/tendinosis but no definite partial or full-thickness tear. 2. No muscle tear or myositis. 3. Normal glenohumeral joint. No findings to suggest septic arthritis. 4. Mild subacromial/subdeltoid bursitis. Electronically Signed   By: Marijo Sanes M.D.   On: 06/17/2017 11:38   Dg Chest Port 1 View  Result Date: 06/16/2017 CLINICAL DATA:  Code sepsis. EXAM: PORTABLE CHEST 1 VIEW COMPARISON:  Chest radiograph performed 06/15/2017 FINDINGS: Persistent right midlung linear opacity is noted. This could reflect pneumonia. Retrocardiac opacity has improved. No pleural effusion or pneumothorax is seen. The cardiomediastinal silhouette is normal in size. No acute osseous  abnormalities are identified. IMPRESSION: Persistent right midlung linear opacity could reflect pneumonia. Retrocardiac opacity is improved. Followup PA and lateral chest X-ray is recommended in 3-4 weeks following trial of antibiotic therapy to ensure resolution and exclude underlying malignancy. Electronically Signed   By: Garald Balding M.D.   On: 06/16/2017 06:40   ECHO Impressions:  - There was no evidence of a vegetation. The right ventricular   systolic pressure was increased consistent with mild pulmonary   hypertension. normal left ventricular systolic function and mild   mitral regurgitation.  Assessment:   Jeremy Humphrey. is a 46 y.o. male with MSSA bacteremia and shoulder pain.   His likely source is his L scalp wound.  His MRI does not show shoulder infection  and he is clinically improving with regards to R shoulder pain but has some swelling and pain over R side of neck and chest.  Fu BCX was ordered but not done yet.    TTE negative. ESR 67, CRP 21 Will need to monitor for other sites of metastatic infection.  Recommendations Will need 2 weeks min IV ancef Needs TEE - ordered consult for cardiology Repeat bcx to document clearance - once neg 48 hours can place PICC. Check CT chest given abnml CXR, some chest pain and diff swallowing. Thank you very much for allowing me to participate in the care of this patient. Please call with questions.   Cheral Marker. Ola Spurr, MD

## 2017-06-17 NOTE — Progress Notes (Signed)
[employer] - [place] at [facility]                                                                                                                                                                                  Patient Demographics   Jeremy Humphrey, is a 46 y.o. male, DOB - 02-Oct-1970, WUJ:[id number]  Admit date - 06/16/2017   Admitting Physician Arnaldo Natal, MD  Outpatient Primary MD for the patient is Patient, No Pcp Per   LOS - 1  Subjective: Patient admitted with MSSA bacteremia  He has noticed some swelling over the right chest region He complains of some pain in the right shoulder but able to move the shoulder   Review of Systems:   CONSTITUTIONAL: No documented fever. No fatigue, weakness. No weight gain, no weight loss.  EYES: No blurry or double vision.  ENT: No tinnitus. No postnasal drip. No redness of the oropharynx.  RESPIRATORY: No cough, no wheeze, no hemoptysis. No dyspnea.  CARDIOVASCULAR: No chest pain. No orthopnea. No palpitations. No syncope.  GASTROINTESTINAL: No nausea, no vomiting or diarrhea. No abdominal pain. No melena or hematochezia.  GENITOURINARY: No dysuria or hematuria.  ENDOCRINE: No polyuria or nocturia. No heat or cold intolerance.  HEMATOLOGY: No anemia. No bruising. No bleeding.  INTEGUMENTARY: No rashes. No lesions.  MUSCULOSKELETAL: No arthritis. No swelling. No gout. Some right shoulder pain as well as right-sided chest pain NEUROLOGIC: No numbness, tingling, or ataxia. No seizure-type activity.  PSYCHIATRIC: No anxiety. No insomnia. No ADD.    Vitals:   Vitals:   06/16/17 1754 06/16/17 1924 06/16/17 1943 06/17/17 0406  BP:   126/74 122/77  Pulse:   90 82  Resp:   19 18  Temp: (!) 102.7 F (39.3 C) 98.9 F (37.2 C) 99.3 F (37.4 C) 97.8 F (36.6 C)  TempSrc: Oral Oral Oral Oral  SpO2:   91% 90%  Weight:    201 lb 3.2 oz (91.3 kg)  Height:        Wt Readings from Last 3 Encounters:  06/17/17 201 lb 3.2  oz (91.3 kg)  06/15/17 200 lb (90.7 kg)  06/12/17 204 lb 9.6 oz (92.8 kg)     Intake/Output Summary (Last 24 hours) at 06/17/2017 1424 Last data filed at 06/17/2017 9562 Gross per 24 hour  Intake 3330 ml  Output -  Net 3330 ml    Physical Exam:   GENERAL: Pleasant-appearing in no apparent distress.  HEAD, EYES, EARS, NOSE AND THROAT: Atraumatic, normocephalic. Extraocular muscles are intact. Pupils equal and reactive to light. Sclerae anicteric. No conjunctival injection. No oro-pharyngeal erythema.  NECK: Supple. There is no jugular venous  distention. No bruits, no lymphadenopathy, no thyromegaly.  HEART: Regular rate and rhythm,. No murmurs, no rubs, no clicks.  LUNGS: Clear to auscultation bilaterally. No rales or rhonchi. No wheezes.  ABDOMEN: Soft, flat, nontender, nondistended. Has good bowel sounds. No hepatosplenomegaly appreciated.  EXTREMITIES: No evidence of any cyanosis, clubbing, or peripheral edema.  +2 pedal and radial pulses bilaterally.  NEUROLOGIC: The patient is alert, awake, and oriented x3 with no focal motor or sensory deficits appreciated bilaterally.  SKIN: Right chest some erythema near the sternal joint Psych: Not anxious, depressed LN: No inguinal LN enlargement    Antibiotics   Anti-infectives (From admission, onward)   Start     Dose/Rate Route Frequency Ordered Stop   06/16/17 1400  ceFAZolin (ANCEF) IVPB 2g/100 mL premix  Status:  Discontinued     2 g 200 mL/hr over 30 Minutes Intravenous Every 8 hours 06/16/17 0722 06/16/17 1019   06/16/17 1400  ceFAZolin (ANCEF) 2 g in dextrose 5 % 100 mL IVPB     2 g 200 mL/hr over 30 Minutes Intravenous Every 8 hours 06/16/17 1019     06/16/17 0600  ceFAZolin (ANCEF) 2 g in dextrose 5 % 100 mL IVPB     2 g 200 mL/hr over 30 Minutes Intravenous  Once 06/16/17 0550 06/16/17 0641   06/16/17 0545  ceFAZolin (ANCEF) IVPB 2g/100 mL premix  Status:  Discontinued     2 g 200 mL/hr over 30 Minutes Intravenous   Once 06/16/17 0542 06/16/17 0549      Medications   Scheduled Meds: . docusate sodium  100 mg Oral BID  . enoxaparin (LOVENOX) injection  40 mg Subcutaneous Q24H  . pravastatin  20 mg Oral q1800   Continuous Infusions: . sodium chloride 125 mL/hr at 06/17/17 0855  . ceFAZolin (ANCEF) IVPB (doses >1 g) Stopped (06/17/17 0600)   PRN Meds:.acetaminophen **OR** acetaminophen, cyclobenzaprine, diazepam, ibuprofen, morphine injection, ondansetron **OR** ondansetron (ZOFRAN) IV, traMADol   Data Review:   Micro Results Recent Results (from the past 240 hour(s))  Blood culture (routine x 2)     Status: Abnormal (Preliminary result)   Collection Time: 06/15/17  7:05 AM  Result Value Ref Range Status   Specimen Description BLOOD LT FOREARM  Final   Special Requests   Final    BOTTLES DRAWN AEROBIC AND ANAEROBIC Blood Culture results may not be optimal due to an excessive volume of blood received in culture bottles   Culture  Setup Time   Final    GRAM POSITIVE COCCI IN BOTH AEROBIC AND ANAEROBIC BOTTLES CRITICAL RESULT CALLED TO, READ BACK BY AND VERIFIED WITH: MATT MCBANE AT 2220 ON 06/15/17 RWW    Culture STAPHYLOCOCCUS AUREUS (A)  Final   Report Status PENDING  Incomplete  Blood culture (routine x 2)     Status: Abnormal (Preliminary result)   Collection Time: 06/15/17  7:05 AM  Result Value Ref Range Status   Specimen Description BLOOD LT UPPER ARM  Final   Special Requests   Final    BOTTLES DRAWN AEROBIC AND ANAEROBIC Blood Culture results may not be optimal due to an excessive volume of blood received in culture bottles   Culture  Setup Time   Final    GRAM POSITIVE COCCI IN BOTH AEROBIC AND ANAEROBIC BOTTLES CRITICAL RESULT CALLED TO, READ BACK BY AND VERIFIED WITH: MATT MCBANE AT 2220 ON 06/15/17 RWW    Culture (A)  Final    STAPHYLOCOCCUS AUREUS SUSCEPTIBILITIES TO FOLLOW Performed at  [facility], [address], [place], [place] [address]    Report Status  PENDING  Incomplete  Blood Culture ID Panel (Reflexed)     Status: Abnormal   Collection Time: 06/15/17  7:05 AM  Result Value Ref Range Status   Enterococcus species NOT DETECTED NOT DETECTED Final   Listeria monocytogenes NOT DETECTED NOT DETECTED Final   Staphylococcus species DETECTED (A) NOT DETECTED Final    Comment: CRITICAL RESULT CALLED TO, READ BACK BY AND VERIFIED WITH: MATT MCBANE AT 2220 ON 06/15/17 RWW    Staphylococcus aureus DETECTED (A) NOT DETECTED Final    Comment: Methicillin (oxacillin) susceptible Staphylococcus aureus (MSSA). Preferred therapy is anti staphylococcal beta lactam antibiotic (Cefazolin or Nafcillin), unless clinically contraindicated. CRITICAL RESULT CALLED TO, READ BACK BY AND VERIFIED WITH: MATT MCBANE AT 2220 ON 06/15/17/RWW    Methicillin resistance NOT DETECTED NOT DETECTED Final   Streptococcus species NOT DETECTED NOT DETECTED Final   Streptococcus agalactiae NOT DETECTED NOT DETECTED Final   Streptococcus pneumoniae NOT DETECTED NOT DETECTED Final   Streptococcus pyogenes NOT DETECTED NOT DETECTED Final   Acinetobacter baumannii NOT DETECTED NOT DETECTED Final   Enterobacteriaceae species NOT DETECTED NOT DETECTED Final   Enterobacter cloacae complex NOT DETECTED NOT DETECTED Final   Escherichia coli NOT DETECTED NOT DETECTED Final   Klebsiella oxytoca NOT DETECTED NOT DETECTED Final   Klebsiella pneumoniae NOT DETECTED NOT DETECTED Final   Proteus species NOT DETECTED NOT DETECTED Final   Serratia marcescens NOT DETECTED NOT DETECTED Final   Haemophilus influenzae NOT DETECTED NOT DETECTED Final   Neisseria meningitidis NOT DETECTED NOT DETECTED Final   Pseudomonas aeruginosa NOT DETECTED NOT DETECTED Final   Candida albicans NOT DETECTED NOT DETECTED Final   Candida glabrata NOT DETECTED NOT DETECTED Final   Candida krusei NOT DETECTED NOT DETECTED Final   Candida parapsilosis NOT DETECTED NOT DETECTED Final   Candida tropicalis NOT  DETECTED NOT DETECTED Final    Radiology Reports Dg Chest 2 View  Result Date: 06/15/2017 CLINICAL DATA:  Right shoulder pain beginning 3 days ago. Patient denies trauma. Fever for 3 days. EXAM: CHEST  2 VIEW COMPARISON:  None. FINDINGS: There is a somewhat tubular and platelike opacity in the right base. There appears to be streaky opacity in the left retrocardiac region. No pneumothorax. The heart, hila, mediastinum are normal. No nodules or masses. IMPRESSION: Bibasilar pulmonary opacities. The streaky and somewhat platelike nature suggests the possibility of atelectasis. However, given the history of fever, pneumonia is not excluded. Recommend follow-up to resolution. Electronically Signed   By: Gerome Sam III M.D   On: 06/15/2017 07:49   Dg Shoulder Right  Result Date: 06/15/2017 CLINICAL DATA:  Pain without trauma EXAM: RIGHT SHOULDER - 2+ VIEW COMPARISON:  None. FINDINGS: There is no evidence of fracture or dislocation. There is no evidence of arthropathy or other focal bone abnormality. Soft tissues are unremarkable. IMPRESSION: Negative. Electronically Signed   By: Gerome Sam III M.D   On: 06/15/2017 07:51   Ct Soft Tissue Neck W Contrast  Result Date: 06/15/2017 CLINICAL DATA:  46 year old male with fever, right neck and shoulder pain for 3 days. EXAM: CT NECK WITH CONTRAST TECHNIQUE: Multidetector CT imaging of the neck was performed using the standard protocol following the bolus administration of intravenous contrast. CONTRAST:  75mL ISOVUE-300 IOPAMIDOL (ISOVUE-300) INJECTION 61% COMPARISON:  Chest radiographs 0724 hours today. FINDINGS: Pharynx and larynx: Appears to be physiologic. Small volume retained secretions suspected in  the vallecula and hypopharynx. No tonsillar enlargement or hyper enhancement. The epiglottis appears within normal limits. Negative parapharyngeal and retropharyngeal spaces. Salivary glands: Sublingual space, submandibular glands, and parotid glands  are within normal limits. Thyroid: Negative. Lymph nodes: Appears symmetric and within normal limits. Vascular: Major vascular structures in the neck and at the skullbase appear patent and normal. Limited intracranial: Negative. Visualized orbits: Negative. Mastoids and visualized paranasal sinuses: Scattered bilateral ethmoid and sphenoid sinus opacification and mucosal thickening with some bubbly opacity. The maxillary sinuses are spared (Small left anterior maxillary mucous retention cyst). Tympanic cavities and mastoids are clear. Rightward nasal septal deviation and spurring. Trace bubbly opacity in the left nasal cavity. Skeleton: Bilateral C2-C3 posterior element ankylosis. Severe left C3-C4 facet arthropathy and bulky leftward endplate spurring. No acute osseous abnormality identified. Upper chest: Normal visualized superior mediastinum. The lung apices are clear. Visible axillary lymph nodes are normal. IMPRESSION: 1. No definite acute or inflammatory process in the neck. 2. Scattered ethmoid and sphenoid paranasal sinusitis. 3. 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 Electronically Signed   By: Odessa FlemingH  Hall M.D.   On: 06/15/2017 08:41   Mr Elvera BickerSternum W ZOWo Contrast  Result Date: 06/17/2017 CLINICAL DATA:  Right-sided chest pain possibly associated with shoveling snow. Patient also has fevers. EXAM: MR STERNUM WITHOUT AND WITH CONTRAST TECHNIQUE: Multiplanar multisequence imaging of the chest wall was performed pre and post administration of IV contrast. CONTRAST:  19 cc MultiHance COMPARISON:  None. FINDINGS: Extensive edema like signal abnormality and enhancement in the right pectoralis major muscle medially. There is also fluid and inflammation in the subcutaneous soft tissues overlying the muscle and between the 2 pectoralis muscles in the midline. Findings are most consistent with a muscle strain and partial tearing. No avulsion from  the sternal attachment. The sternum and sternoclavicular joints appear normal. No findings to suggest septic arthritis or osteomyelitis. IMPRESSION: 1. Right pectoralis major muscle tear medially. 2. No findings to suggest septic arthritis or osteomyelitis involving the sternum or sternoclavicular joints. Electronically Signed   By: Rudie MeyerP.  Gallerani M.D.   On: 06/17/2017 11:43   Mr Shoulder Right W Wo Contrast  Result Date: 06/17/2017 CLINICAL DATA:  Right shoulder pain with fever. EXAM: MRI OF THE RIGHT SHOULDER WITHOUT AND WITH CONTRAST TECHNIQUE: Multiplanar, multisequence MR imaging of the right shoulder was performed before and after the administration of intravenous contrast. CONTRAST:  19 cc MultiHance COMPARISON:  Radiographs 06/15/2017 FINDINGS: Examination is somewhat limited by patient motion. Rotator cuff: Mild to moderate rotator cuff tendinopathy/tendinosis but no definite partial or full-thickness rotator cuff tear. Muscles:  No significant findings.  No muscle tear or myositis. Biceps long head:  Intact Acromioclavicular Joint: Mild degenerative changes. Type to acromion. No significant lateral downsloping or undersurface spurring. Glenohumeral Joint: No significant degenerative changes. No joint effusion. No synovitis or abnormal enhancement to suggest septic arthritis. Labrum:  No obvious labral tears. Bones:  No acute bony findings. Other: Mild subacromial/subdeltoid bursitis. IMPRESSION: 1. Mild to moderate rotator cuff tendinopathy/tendinosis but no definite partial or full-thickness tear. 2. No muscle tear or myositis. 3. Normal glenohumeral joint. No findings to suggest septic arthritis. 4. Mild subacromial/subdeltoid bursitis. Electronically Signed   By: Rudie MeyerP.  Gallerani M.D.   On: 06/17/2017 11:38   Dg Chest Port 1 View  Result Date: 06/16/2017 CLINICAL DATA:  Code sepsis. EXAM: PORTABLE CHEST 1 VIEW COMPARISON:  Chest radiograph performed 06/15/2017 FINDINGS: Persistent right midlung  linear opacity  is noted. This could reflect pneumonia. Retrocardiac opacity has improved. No pleural effusion or pneumothorax is seen. The cardiomediastinal silhouette is normal in size. No acute osseous abnormalities are identified. IMPRESSION: Persistent right midlung linear opacity could reflect pneumonia. Retrocardiac opacity is improved. Followup PA and lateral chest X-ray is recommended in 3-4 weeks following trial of antibiotic therapy to ensure resolution and exclude underlying malignancy. Electronically Signed   By: Roanna Raider M.D.   On: 06/16/2017 06:40     CBC Recent Labs  Lab 06/15/17 0651 06/16/17 0548 06/17/17 0429  WBC 8.5 7.5 4.6  HGB 13.8 13.4 11.9*  HCT 39.5* 39.1* 34.8*  PLT 205 194 145*  MCV 88.2 89.4 89.6  MCH 30.9 30.6 30.7  MCHC 35.0 34.3 34.3  RDW 12.6 12.6 13.0  LYMPHSABS 0.7* 0.3*  --   MONOABS 1.0 0.4  --   EOSABS 0.0 0.0  --   BASOSABS 0.0 0.0  --     Chemistries  Recent Labs  Lab 06/15/17 0651 06/16/17 0548 06/17/17 0429  NA 134* 136 139  K 3.2* 3.2* 3.4*  CL 103 102 109  CO2 21* 25 24  GLUCOSE 140* 130* 116*  BUN 13 14 12   CREATININE 1.08 1.13 0.89  CALCIUM 8.6* 8.6* 7.5*  AST 38 47*  --   ALT 53 70*  --   ALKPHOS 63 63  --   BILITOT 1.6* 1.7*  --    ------------------------------------------------------------------------------------------------------------------ estimated creatinine clearance is 111.4 mL/min (by C-G formula based on SCr of 0.89 mg/dL). ------------------------------------------------------------------------------------------------------------------ No results for input(s): HGBA1C in the last 72 hours. ------------------------------------------------------------------------------------------------------------------ No results for input(s): CHOL, HDL, LDLCALC, TRIG, CHOLHDL, LDLDIRECT in the last 72  hours. ------------------------------------------------------------------------------------------------------------------ No results for input(s): TSH, T4TOTAL, T3FREE, THYROIDAB in the last 72 hours.  Invalid input(s): FREET3 ------------------------------------------------------------------------------------------------------------------ No results for input(s): VITAMINB12, FOLATE, FERRITIN, TIBC, IRON, RETICCTPCT in the last 72 hours.  Coagulation profile Recent Labs  Lab 06/16/17 0548  INR 1.16    No results for input(s): DDIMER in the last 72 hours.  Cardiac Enzymes Recent Labs  Lab 06/15/17 0651 06/16/17 0548  TROPONINI <0.03 <0.03   ------------------------------------------------------------------------------------------------------------------ Invalid input(s): POCBNP    Assessment & Plan   1.    MSSA bacteremia Continue therapy with IV antibiotics ID following the patient Mri of right shoulder no evidence infection.   2.  Right sided chest pain shoulder pain Inflammatory in nature no evidence of joint infection Motrin for anti-inflammatory purposes  3.hypokalemia we will replace potassium   4. Obesity: BMI is 30.2; encouraged healthy diet and exercise. 5.  DVT prophylaxis: Lovenox 6.  GI prophylaxis: None       Code Status Orders  (From admission, onward)        Start     Ordered   06/16/17 0703  Full code  Continuous     06/16/17 0702    Code Status History    Date Active Date Inactive Code Status Order ID Comments User Context   This patient has a current code status but no historical code status.           Consults orthopedics, infectious disease  DVT Prophylaxis  Lovenox  Lab Results  Component Value Date   PLT 145 (L) 06/17/2017     Time Spent in minutes 35 minutes greater than 50% of time spent in care coordination and counseling patient regarding the condition and plan of care.   Auburn Bilberry M.D on 06/17/2017 at  2:24 PM  Between 7am to 6pm - Pager - [phone]  After 6pm go to [email or web address] - password [email or web address] [facility]   Office  [phone]

## 2017-06-17 NOTE — Plan of Care (Signed)
VSS, afebrile, free of falls during shift.  Reported R shoulder pain 4-6/10, improved w/ PRN IV Morphine 2-4mg  x3 during shift.  Reported HA pain 1/10, improved w/ PRN PO Ibuprofen 400mg .  No other needs overnight.  Bed in low position, call bell within reach.  WCTM.

## 2017-06-17 NOTE — Plan of Care (Signed)
  Progressing Education: Knowledge of General Education information will improve 06/17/2017 1258 - Progressing by Luretha MurphyMiles, Tazaria Dlugosz, RN Fluid Volume: Hemodynamic stability will improve 06/17/2017 1258 - Progressing by Luretha MurphyMiles, Dickson Kostelnik, RN Clinical Measurements: Diagnostic test results will improve 06/17/2017 1258 - Progressing by Luretha MurphyMiles, Tikesha Mort, RN Signs and symptoms of infection will decrease 06/17/2017 1258 - Progressing by Luretha MurphyMiles, Vernice Bowker, RN Respiratory: Ability to maintain adequate ventilation will improve 06/17/2017 1258 - Progressing by Luretha MurphyMiles, Syniah Berne, RN

## 2017-06-17 NOTE — Progress Notes (Signed)
Subjective:  Patient seen in his hospital room today with his wife at the bedside. He states that his shoulder feels better but he has point tenderness over the pectoralis muscle near his sternum. Patient reports pain as mild to moderate.    Objective:   VITALS:   Vitals:   06/16/17 1754 06/16/17 1924 06/16/17 1943 06/17/17 0406  BP:   126/74 122/77  Pulse:   90 82  Resp:   19 18  Temp: (!) 102.7 F (39.3 C) 98.9 F (37.2 C) 99.3 F (37.4 C) 97.8 F (36.6 C)  TempSrc: Oral Oral Oral Oral  SpO2:   91% 90%  Weight:    91.3 kg (201 lb 3.2 oz)  Height:        PHYSICAL EXAM: Right upper extremity: Patient has improved shoulder range of motion without pain. He is mild erythema and focal tenderness over the right pectoralis major muscle near the sternal origin. There is no fluctuance. He is remains nervous intact in the right upper extremity demonstrated no weakness.   LABS  Results for orders placed or performed during the hospital encounter of 06/16/17 (from the past 24 hour(s))  Lactic acid, plasma     Status: None   Collection Time: 06/16/17  3:21 PM  Result Value Ref Range   Lactic Acid, Venous 1.4 0.5 - 1.9 mmol/L  C-reactive protein     Status: Abnormal   Collection Time: 06/16/17  3:21 PM  Result Value Ref Range   CRP 21.5 (H) <1.0 mg/dL  Sedimentation rate     Status: Abnormal   Collection Time: 06/16/17  3:21 PM  Result Value Ref Range   Sed Rate 67 (H) 0 - 15 mm/hr  CBC     Status: Abnormal   Collection Time: 06/17/17  4:29 AM  Result Value Ref Range   WBC 4.6 3.8 - 10.6 K/uL   RBC 3.88 (L) 4.40 - 5.90 MIL/uL   Hemoglobin 11.9 (L) 13.0 - 18.0 g/dL   HCT 16.134.8 (L) 09.640.0 - 04.552.0 %   MCV 89.6 80.0 - 100.0 fL   MCH 30.7 26.0 - 34.0 pg   MCHC 34.3 32.0 - 36.0 g/dL   RDW 40.913.0 81.111.5 - 91.414.5 %   Platelets 145 (L) 150 - 440 K/uL  Basic metabolic panel     Status: Abnormal   Collection Time: 06/17/17  4:29 AM  Result Value Ref Range   Sodium 139 135 - 145 mmol/L   Potassium 3.4 (L) 3.5 - 5.1 mmol/L   Chloride 109 101 - 111 mmol/L   CO2 24 22 - 32 mmol/L   Glucose, Bld 116 (H) 65 - 99 mg/dL   BUN 12 6 - 20 mg/dL   Creatinine, Ser 7.820.89 0.61 - 1.24 mg/dL   Calcium 7.5 (L) 8.9 - 10.3 mg/dL   GFR calc non Af Amer >60 >60 mL/min   GFR calc Af Amer >60 >60 mL/min   Anion gap 6 5 - 15    Mr Elvera BickerSternum W Wo Contrast  Result Date: 06/17/2017 CLINICAL DATA:  Right-sided chest pain possibly associated with shoveling snow. Patient also has fevers. EXAM: MR STERNUM WITHOUT AND WITH CONTRAST TECHNIQUE: Multiplanar multisequence imaging of the chest wall was performed pre and post administration of IV contrast. CONTRAST:  19 cc MultiHance COMPARISON:  None. FINDINGS: Extensive edema like signal abnormality and enhancement in the right pectoralis major muscle medially. There is also fluid and inflammation in the subcutaneous soft tissues overlying the muscle and between the  2 pectoralis muscles in the midline. Findings are most consistent with a muscle strain and partial tearing. No avulsion from the sternal attachment. The sternum and sternoclavicular joints appear normal. No findings to suggest septic arthritis or osteomyelitis. IMPRESSION: 1. Right pectoralis major muscle tear medially. 2. No findings to suggest septic arthritis or osteomyelitis involving the sternum or sternoclavicular joints. Electronically Signed   By: Rudie MeyerP.  Gallerani M.D.   On: 06/17/2017 11:43   Mr Shoulder Right W Wo Contrast  Result Date: 06/17/2017 CLINICAL DATA:  Right shoulder pain with fever. EXAM: MRI OF THE RIGHT SHOULDER WITHOUT AND WITH CONTRAST TECHNIQUE: Multiplanar, multisequence MR imaging of the right shoulder was performed before and after the administration of intravenous contrast. CONTRAST:  19 cc MultiHance COMPARISON:  Radiographs 06/15/2017 FINDINGS: Examination is somewhat limited by patient motion. Rotator cuff: Mild to moderate rotator cuff tendinopathy/tendinosis but no definite  partial or full-thickness rotator cuff tear. Muscles:  No significant findings.  No muscle tear or myositis. Biceps long head:  Intact Acromioclavicular Joint: Mild degenerative changes. Type to acromion. No significant lateral downsloping or undersurface spurring. Glenohumeral Joint: No significant degenerative changes. No joint effusion. No synovitis or abnormal enhancement to suggest septic arthritis. Labrum:  No obvious labral tears. Bones:  No acute bony findings. Other: Mild subacromial/subdeltoid bursitis. IMPRESSION: 1. Mild to moderate rotator cuff tendinopathy/tendinosis but no definite partial or full-thickness tear. 2. No muscle tear or myositis. 3. Normal glenohumeral joint. No findings to suggest septic arthritis. 4. Mild subacromial/subdeltoid bursitis. Electronically Signed   By: Rudie MeyerP.  Gallerani M.D.   On: 06/17/2017 11:38   Dg Chest Port 1 View  Result Date: 06/16/2017 CLINICAL DATA:  Code sepsis. EXAM: PORTABLE CHEST 1 VIEW COMPARISON:  Chest radiograph performed 06/15/2017 FINDINGS: Persistent right midlung linear opacity is noted. This could reflect pneumonia. Retrocardiac opacity has improved. No pleural effusion or pneumothorax is seen. The cardiomediastinal silhouette is normal in size. No acute osseous abnormalities are identified. IMPRESSION: Persistent right midlung linear opacity could reflect pneumonia. Retrocardiac opacity is improved. Followup PA and lateral chest X-ray is recommended in 3-4 weeks following trial of antibiotic therapy to ensure resolution and exclude underlying malignancy. Electronically Signed   By: Roanna RaiderJeffery  Chang M.D.   On: 06/16/2017 06:40    Assessment/Plan:     Active Problems:   Sepsis (HCC)  Reviewed the patient's MRI of the right shoulder and sternum with Dr. Allena KatzPatel in radiology. There is no shoulder effusion to suggest a septic shoulder joint. Dr. Allena KatzPatel felt no glenohumeral joint aspiration was necessary given the findings. There is edema in the  medial aspect of the right pectoralis major muscle which is consistent with possible strain. The MRI of the shoulder showed tendinosis and bursitis but no tear.  Based on these MRI findings, I do not recommend any surgical intervention. Continue IV antibiotics as prescribed. A chest x-ray performed yesterday shows an infiltrated recent history of possible pneumonia. I appreciate infectious disease following.  Happy to see the patient back in the office for treatment of his tendinosis and bursitis if his symptoms persist. Continue ibuprofen 600 mg 3 times a day to treat his tendinosis and bursitis in the meantime.  I'm going to sign off for now. Please reconsult if there is any further orthopedic issues.    Juanell FairlyKRASINSKI, Chastity Noland , MD 06/17/2017, 2:11 PM

## 2017-06-18 LAB — CULTURE, BLOOD (ROUTINE X 2)

## 2017-06-18 MED ORDER — SODIUM CHLORIDE 0.9 % IV SOLN
INTRAVENOUS | Status: DC
  Administered 2017-06-19: 11:00:00 via INTRAVENOUS

## 2017-06-18 NOTE — Progress Notes (Signed)
[name] PROGRESS NOTE Date of Admission:  06/16/2017     ID: Jeremy Humphrey. is a 45 y.o. male with MSSA bacteremia Active Problems:   Sepsis (Jeremy Humphrey)   Subjective: No fevers, wbc down to 4.  CT chest shows pna. TEE tomrorrow  ROS  Eleven systems are reviewed and negative except per hpi  Medications:  Antibiotics Given (last 72 hours)    Date/Time Action Medication Dose Rate   06/16/17 0603 New Bag/Given   ceFAZolin (ANCEF) 2 g in dextrose 5 % 100 mL IVPB 2 g 200 mL/hr   06/16/17 1521 New Bag/Given   ceFAZolin (ANCEF) 2 g in dextrose 5 % 100 mL IVPB 2 g 200 mL/hr   06/16/17 2128 New Bag/Given   ceFAZolin (ANCEF) 2 g in dextrose 5 % 100 mL IVPB 2 g 200 mL/hr   06/17/17 0530 New Bag/Given   ceFAZolin (ANCEF) 2 g in dextrose 5 % 100 mL IVPB 2 g 200 mL/hr   06/17/17 1458 New Bag/Given   ceFAZolin (ANCEF) 2 g in dextrose 5 % 100 mL IVPB 2 g 200 mL/hr   06/17/17 2002 New Bag/Given   ceFAZolin (ANCEF) 2 g in dextrose 5 % 100 mL IVPB 2 g 200 mL/hr   06/18/17 0406 New Bag/Given   ceFAZolin (ANCEF) 2 g in dextrose 5 % 100 mL IVPB 2 g 200 mL/hr     . docusate sodium  100 mg Oral BID  . enoxaparin (LOVENOX) injection  40 mg Subcutaneous Q24H  . pravastatin  20 mg Oral q1800    Objective: Vital signs in last 24 hours: Temp:  [97.8 F (36.6 C)-98.6 F (37 C)] 98.6 F (37 C) (12/18 0837) Pulse Rate:  [88-97] 88 (12/18 0837) Resp:  [16-20] 20 (12/18 0837) BP: (129-135)/(72-79) 129/79 (12/18 0837) SpO2:  [86 %-95 %] 95 % (12/18 0837) Weight:  [95.5 kg (210 lb 8 oz)] 95.5 kg (210 lb 8 oz) (12/18 0529) Constitutional: He is oriented to person, place, and time. He appears well-developed and well-nourished. No distress.  HENT: anicteric Mouth/Throat: Oropharynx is clear and moist. No oropharyngeal exudate.  Cardiovascular: Normal rate, regular rhythm and normal heart sounds.  Pulmonary/Chest:  bil rhonchi Abdominal: Soft. Bowel sounds are normal. He  exhibits no distension. There is no tenderness.  Lymphadenopathy: He has no cervical adenopathy.  Neurological: He is alert and oriented to person, place, and time.  Skin: Skin is warm and dry. L scalp with a 1 cm wound, no redness Psychiatric: He has a normal mood and affect. His behavior is normal.     Lab Results Recent Labs    06/16/17 0548 06/17/17 0429  WBC 7.5 4.6  HGB 13.4 11.9*  HCT 39.1* 34.8*  NA 136 139  K 3.2* 3.4*  CL 102 109  CO2 25 24  BUN 14 12  CREATININE 1.13 0.89    Microbiology: Results for orders placed or performed during the hospital encounter of 06/16/17  Urine culture     Status: None   Collection Time: 06/16/17 12:49 PM  Result Value Ref Range Status   Specimen Description URINE, RANDOM  Final   Special Requests NONE  Final   Culture   Final    NO GROWTH Performed at [facility], [place] [address] [place], [place] [address]    Report Status 06/17/2017 FINAL  Final  Culture, blood (single) w Reflex to ID Panel     Status: None (Preliminary result)   Collection Time:  06/17/17  3:44 PM  Result Value Ref Range Status   Specimen Description BLOOD Blood Culture adequate volume  Final   Special Requests RIGHT ANTECUBITAL  Final   Culture NO GROWTH < 24 HOURS  Final   Report Status PENDING  Incomplete    Studies/Results: Ct Chest Wo Contrast  Result Date: 06/17/2017 CLINICAL DATA:  Fever EXAM: CT CHEST WITHOUT CONTRAST TECHNIQUE: Multidetector CT imaging of the chest was performed following the standard protocol without IV contrast. COMPARISON:  Chest x-ray 06/16/2017. FINDINGS: Cardiovascular: Heart size normal. Trace pericardial fluid noted. No thoracic aortic aneurysm. Mediastinum/Nodes: No mediastinal lymphadenopathy. Soft tissue fullness noted in the hilar regions bilaterally, but not well evaluated on this noncontrast exam. The esophagus has normal imaging features. There is no axillary lymphadenopathy. Lungs/Pleura: Interlobular septal  thickening noted in the lung apices. Patchy ill-defined nodularity is seen in the central lungs bilaterally, right greater than left having a tree-in-bud centrilobular distribution in some regions. Features are more confluent in the right lower lobe with bite lateral lower lobe dependent collapse/consolidation. Small bilateral pleural effusions are associated. Upper Abdomen: 15 mm hypoattenuating lesion is identified in the left liver, incompletely characterize but potentially a cyst. Musculoskeletal: Edema seen in the subcutaneous tissues of the anterior chest wall, anterior to the sternum and in the right paramidline chest. This is associated with some retrosternal edema, progressive since neck CT of 06/15/2017. No focal fluid collection is evident. Features presumably related to the right pectoralis major tear seen on MRI of 06/17/2017. IMPRESSION: 1. Interlobular septal thickening with patchy ill-defined central ground-glass nodularity in both lungs, right greater than left. There is more confluent collapse/consolidation in the lower lobes bilaterally. Overall imaging features are not typical for septic emboli. Bilateral infectious/inflammatory etiology suspected including broncho pneumonia and atypical infection. Pulmonary edema or hemorrhage could present similarly. 2. Small bilateral pleural effusions with bilateral lower lobe collapse/consolidation dependently. 3. Edema in the medial aspect of the anterior right chest wall with associated retrosternal edema, progressive since 06/15/2017. This is presumably related to the pectoralis major injury identified on recent MRI. Electronically Signed   By: Jeremy Humphrey M.D.   On: 06/17/2017 19:49   Mr Jeremy Humphrey WE Contrast  Result Date: 06/17/2017 CLINICAL DATA:  Right-sided chest pain possibly associated with shoveling snow. Patient also has fevers. EXAM: MR STERNUM WITHOUT AND WITH CONTRAST TECHNIQUE: Multiplanar multisequence imaging of the chest wall was  performed pre and post administration of IV contrast. CONTRAST:  19 cc MultiHance COMPARISON:  None. FINDINGS: Extensive edema like signal abnormality and enhancement in the right pectoralis major muscle medially. There is also fluid and inflammation in the subcutaneous soft tissues overlying the muscle and between the 2 pectoralis muscles in the midline. Findings are most consistent with a muscle strain and partial tearing. No avulsion from the sternal attachment. The sternum and sternoclavicular joints appear normal. No findings to suggest septic arthritis or osteomyelitis. IMPRESSION: 1. Right pectoralis major muscle tear medially. 2. No findings to suggest septic arthritis or osteomyelitis involving the sternum or sternoclavicular joints. Electronically Signed   By: Marijo Sanes M.D.   On: 06/17/2017 11:43   Mr Shoulder Right W Wo Contrast  Result Date: 06/17/2017 CLINICAL DATA:  Right shoulder pain with fever. EXAM: MRI OF THE RIGHT SHOULDER WITHOUT AND WITH CONTRAST TECHNIQUE: Multiplanar, multisequence MR imaging of the right shoulder was performed before and after the administration of intravenous contrast. CONTRAST:  19 cc MultiHance COMPARISON:  Radiographs 06/15/2017 FINDINGS: Examination is somewhat limited  by patient motion. Rotator cuff: Mild to moderate rotator cuff tendinopathy/tendinosis but no definite partial or full-thickness rotator cuff tear. Muscles:  No significant findings.  No muscle tear or myositis. Biceps long head:  Intact Acromioclavicular Joint: Mild degenerative changes. Type to acromion. No significant lateral downsloping or undersurface spurring. Glenohumeral Joint: No significant degenerative changes. No joint effusion. No synovitis or abnormal enhancement to suggest septic arthritis. Labrum:  No obvious labral tears. Bones:  No acute bony findings. Other: Mild subacromial/subdeltoid bursitis. IMPRESSION: 1. Mild to moderate rotator cuff tendinopathy/tendinosis but no  definite partial or full-thickness tear. 2. No muscle tear or myositis. 3. Normal glenohumeral joint. No findings to suggest septic arthritis. 4. Mild subacromial/subdeltoid bursitis. Electronically Signed   By: Marijo Sanes M.D.   On: 06/17/2017 11:38   Echo    Impressions:  - There was no evidence of a vegetation. The right ventricular   systolic pressure was increased consistent with mild pulmonary   hypertension. normal left ventricular systolic function and mild   mitral regurgitation.   Assessment/Plan: Jeremy Unrein. is a 45 y.o. male with MSSA bacteremia and shoulder pain.   His likely source is his L scalp wound.  His MRI does not show shoulder infection and he is clinically improving with regards to R shoulder pain but has some swelling and pain over R side of neck and chest.  Fu BCX P from 12/17.  TTE negative. ESR 67, CRP 21. CT chest with PNA but no septic emboli.  TEE pending  Recommendations Will need 2 weeks min IV ancef Once repeat bcx neg from 12/17 for 48 hours can place PICC. Likely tomororow.    Thank you very much for the consult. Will follow with you.  Leonel Ramsay   06/18/2017, 1:34 PM

## 2017-06-18 NOTE — Consult Note (Signed)
[facility]  CARDIOLOGY CONSULT NOTE  Patient ID: Jeremy AmericanJoseph A Toenjes Jr. MRN: [id number] DOB/AGE: 48/12/1970 48 y.o.  Admit date: 06/16/2017 Referring Physician Allena KatzPatel Primary Physician [facility] Cardiologist [facility] [place] Reason for Consultation bacteremia  HPI: 46 year old gentleman referred for transesophageal echocardiogram for bacteremia.  The patient has a history of SVT stable status post catheter ablation.  The patient was in his usual state of health until he presented [facility] room 06/15/2017 for right shoulder pain.  The patient had associated fever, with a temperature of 102.5.  Blood cultures were positive for methicillin sensitive Staphylococcus aureus and started on intravenous Ancef.  The patient was seen by Dr. Sampson GoonFitzgerald.  MRI was negative for septic joint.  2D echocardiogram revealed normal left ventricular function, with LVEF 55%, mild mitral regurgitation, without evidence for vegetation.  Transesophageal echocardiogram is recommended.  Review of systems complete and found to be negative unless listed above     Past Medical History:  Diagnosis Date  . SVT (supraventricular tachycardia) (HCC) 12/2014    Past Surgical History:  Procedure Laterality Date  . ABLATION     SVT    Medications Prior to Admission  Medication Sig Dispense Refill Last Dose  . cyclobenzaprine (FLEXERIL) 10 MG tablet Take 1 tablet (10 mg total) by mouth 2 (two) times daily as needed for muscle spasms. Do not drive while taking as can cause drowsiness 15 tablet 0 prn at prn  . diazepam (VALIUM) 5 MG tablet Take 1 tablet (5 mg total) by mouth every 8 (eight) hours as needed for muscle spasms. 20 tablet 0 prn at prn  . ibuprofen (ADVIL,MOTRIN) 800 MG tablet Take 1 tablet (800 mg total) by mouth every 8 (eight) hours as needed. 30 tablet 0   . lovastatin (MEVACOR) 20 MG tablet Take 20 mg by mouth every evening.   Past Week at Unknown time   Social History   Socioeconomic History  . Marital  status: Single    Spouse name: Not on file  . Number of children: Not on file  . Years of education: Not on file  . Highest education level: Not on file  Social Needs  . Financial resource strain: Not on file  . Food insecurity - worry: Not on file  . Food insecurity - inability: Not on file  . Transportation needs - medical: Not on file  . Transportation needs - non-medical: Not on file  Occupational History  . Not on file  Tobacco Use  . Smoking status: Never Smoker  . Smokeless tobacco: Never Used  Substance and Sexual Activity  . Alcohol use: Yes  . Drug use: Not on file  . Sexual activity: Not on file  Other Topics Concern  . Not on file  Social History Narrative  . Not on file    Family History  Problem Relation Age of Onset  . Cancer Father   . Diabetes Maternal Grandmother       Review of systems complete and found to be negative unless listed above      PHYSICAL EXAM  General: Well developed, well nourished, in no acute distress HEENT:  Normocephalic and atramatic Neck:  No JVD.  Lungs: Clear bilaterally to auscultation and percussion. Heart: HRRR . Normal S1 and S2 without gallops or murmurs.  Abdomen: Bowel sounds are positive, abdomen soft and non-tender  Msk:  Back normal, normal gait. Normal strength and tone for age. Extremities: No clubbing, cyanosis or edema.   Neuro: Alert and oriented X  3. Psych:  Good affect, responds appropriately  Labs:   Lab Results  Component Value Date   WBC 4.6 06/17/2017   HGB 11.9 (L) 06/17/2017   HCT 34.8 (L) 06/17/2017   MCV 89.6 06/17/2017   PLT 145 (L) 06/17/2017    Recent Labs  Lab 06/16/17 0548 06/17/17 0429  NA 136 139  K 3.2* 3.4*  CL 102 109  CO2 25 24  BUN 14 12  CREATININE 1.13 0.89  CALCIUM 8.6* 7.5*  PROT 7.4  --   BILITOT 1.7*  --   ALKPHOS 63  --   ALT 70*  --   AST 47*  --   GLUCOSE 130* 116*   Lab Results  Component Value Date   TROPONINI <0.03 06/16/2017   No results found  for: CHOL No results found for: HDL No results found for: LDLCALC No results found for: TRIG No results found for: CHOLHDL No results found for: LDLDIRECT    Radiology: Dg Chest 2 View  Result Date: 06/15/2017 CLINICAL DATA:  Right shoulder pain beginning 3 days ago. Patient denies trauma. Fever for 3 days. EXAM: CHEST  2 VIEW COMPARISON:  None. FINDINGS: There is a somewhat tubular and platelike opacity in the right base. There appears to be streaky opacity in the left retrocardiac region. No pneumothorax. The heart, hila, mediastinum are normal. No nodules or masses. IMPRESSION: Bibasilar pulmonary opacities. The streaky and somewhat platelike nature suggests the possibility of atelectasis. However, given the history of fever, pneumonia is not excluded. Recommend follow-up to resolution. Electronically Signed   By: Gerome Samavid  Williams III M.D   On: 06/15/2017 07:49   Dg Shoulder Right  Result Date: 06/15/2017 CLINICAL DATA:  Pain without trauma EXAM: RIGHT SHOULDER - 2+ VIEW COMPARISON:  None. FINDINGS: There is no evidence of fracture or dislocation. There is no evidence of arthropathy or other focal bone abnormality. Soft tissues are unremarkable. IMPRESSION: Negative. Electronically Signed   By: Gerome Samavid  Williams III M.D   On: 06/15/2017 07:51   Ct Soft Tissue Neck W Contrast  Result Date: 06/15/2017 CLINICAL DATA:  46 year old male with fever, right neck and shoulder pain for 3 days. EXAM: CT NECK WITH CONTRAST TECHNIQUE: Multidetector CT imaging of the neck was performed using the standard protocol following the bolus administration of intravenous contrast. CONTRAST:  75mL ISOVUE-300 IOPAMIDOL (ISOVUE-300) INJECTION 61% COMPARISON:  Chest radiographs 0724 hours today. FINDINGS: Pharynx and larynx: Appears to be physiologic. Small volume retained secretions suspected in the vallecula and hypopharynx. No tonsillar enlargement or hyper enhancement. The epiglottis appears within normal limits.  Negative parapharyngeal and retropharyngeal spaces. Salivary glands: Sublingual space, submandibular glands, and parotid glands are within normal limits. Thyroid: Negative. Lymph nodes: Appears symmetric and within normal limits. Vascular: Major vascular structures in the neck and at the skullbase appear patent and normal. Limited intracranial: Negative. Visualized orbits: Negative. Mastoids and visualized paranasal sinuses: Scattered bilateral ethmoid and sphenoid sinus opacification and mucosal thickening with some bubbly opacity. The maxillary sinuses are spared (Small left anterior maxillary mucous retention cyst). Tympanic cavities and mastoids are clear. Rightward nasal septal deviation and spurring. Trace bubbly opacity in the left nasal cavity. Skeleton: Bilateral C2-C3 posterior element ankylosis. Severe left C3-C4 facet arthropathy and bulky leftward endplate spurring. No acute osseous abnormality identified. Upper chest: Normal visualized superior mediastinum. The lung apices are clear. Visible axillary lymph nodes are normal. IMPRESSION: 1. No definite acute or inflammatory process in the neck. 2. Scattered ethmoid and sphenoid paranasal sinusitis.  3. 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 Electronically Signed   By: Odessa Fleming M.D.   On: 06/15/2017 08:41   Ct Chest Wo Contrast  Result Date: 06/17/2017 CLINICAL DATA:  Fever EXAM: CT CHEST WITHOUT CONTRAST TECHNIQUE: Multidetector CT imaging of the chest was performed following the standard protocol without IV contrast. COMPARISON:  Chest x-ray 06/16/2017. FINDINGS: Cardiovascular: Heart size normal. Trace pericardial fluid noted. No thoracic aortic aneurysm. Mediastinum/Nodes: No mediastinal lymphadenopathy. Soft tissue fullness noted in the hilar regions bilaterally, but not well evaluated on this noncontrast exam. The esophagus has normal imaging features. There is no axillary  lymphadenopathy. Lungs/Pleura: Interlobular septal thickening noted in the lung apices. Patchy ill-defined nodularity is seen in the central lungs bilaterally, right greater than left having a tree-in-bud centrilobular distribution in some regions. Features are more confluent in the right lower lobe with bite lateral lower lobe dependent collapse/consolidation. Small bilateral pleural effusions are associated. Upper Abdomen: 15 mm hypoattenuating lesion is identified in the left liver, incompletely characterize but potentially a cyst. Musculoskeletal: Edema seen in the subcutaneous tissues of the anterior chest wall, anterior to the sternum and in the right paramidline chest. This is associated with some retrosternal edema, progressive since neck CT of 06/15/2017. No focal fluid collection is evident. Features presumably related to the right pectoralis major tear seen on MRI of 06/17/2017. IMPRESSION: 1. Interlobular septal thickening with patchy ill-defined central ground-glass nodularity in both lungs, right greater than left. There is more confluent collapse/consolidation in the lower lobes bilaterally. Overall imaging features are not typical for septic emboli. Bilateral infectious/inflammatory etiology suspected including broncho pneumonia and atypical infection. Pulmonary edema or hemorrhage could present similarly. 2. Small bilateral pleural effusions with bilateral lower lobe collapse/consolidation dependently. 3. Edema in the medial aspect of the anterior right chest wall with associated retrosternal edema, progressive since 06/15/2017. This is presumably related to the pectoralis major injury identified on recent MRI. Electronically Signed   By: Kennith Center M.D.   On: 06/17/2017 19:49   Mr Elvera Bicker ON Contrast  Result Date: 06/17/2017 CLINICAL DATA:  Right-sided chest pain possibly associated with shoveling snow. Patient also has fevers. EXAM: MR STERNUM WITHOUT AND WITH CONTRAST TECHNIQUE:  Multiplanar multisequence imaging of the chest wall was performed pre and post administration of IV contrast. CONTRAST:  19 cc MultiHance COMPARISON:  None. FINDINGS: Extensive edema like signal abnormality and enhancement in the right pectoralis major muscle medially. There is also fluid and inflammation in the subcutaneous soft tissues overlying the muscle and between the 2 pectoralis muscles in the midline. Findings are most consistent with a muscle strain and partial tearing. No avulsion from the sternal attachment. The sternum and sternoclavicular joints appear normal. No findings to suggest septic arthritis or osteomyelitis. IMPRESSION: 1. Right pectoralis major muscle tear medially. 2. No findings to suggest septic arthritis or osteomyelitis involving the sternum or sternoclavicular joints. Electronically Signed   By: Rudie Meyer M.D.   On: 06/17/2017 11:43   Mr Shoulder Right W Wo Contrast  Result Date: 06/17/2017 CLINICAL DATA:  Right shoulder pain with fever. EXAM: MRI OF THE RIGHT SHOULDER WITHOUT AND WITH CONTRAST TECHNIQUE: Multiplanar, multisequence MR imaging of the right shoulder was performed before and after the administration of intravenous contrast. CONTRAST:  19 cc MultiHance COMPARISON:  Radiographs 06/15/2017 FINDINGS: Examination is somewhat limited by patient motion. Rotator cuff: Mild to moderate rotator cuff tendinopathy/tendinosis but no definite partial or full-thickness rotator  cuff tear. Muscles:  No significant findings.  No muscle tear or myositis. Biceps long head:  Intact Acromioclavicular Joint: Mild degenerative changes. Type to acromion. No significant lateral downsloping or undersurface spurring. Glenohumeral Joint: No significant degenerative changes. No joint effusion. No synovitis or abnormal enhancement to suggest septic arthritis. Labrum:  No obvious labral tears. Bones:  No acute bony findings. Other: Mild subacromial/subdeltoid bursitis. IMPRESSION: 1. Mild to  moderate rotator cuff tendinopathy/tendinosis but no definite partial or full-thickness tear. 2. No muscle tear or myositis. 3. Normal glenohumeral joint. No findings to suggest septic arthritis. 4. Mild subacromial/subdeltoid bursitis. Electronically Signed   By: Rudie Meyer M.D.   On: 06/17/2017 11:38   Dg Chest Port 1 View  Result Date: 06/16/2017 CLINICAL DATA:  Code sepsis. EXAM: PORTABLE CHEST 1 VIEW COMPARISON:  Chest radiograph performed 06/15/2017 FINDINGS: Persistent right midlung linear opacity is noted. This could reflect pneumonia. Retrocardiac opacity has improved. No pleural effusion or pneumothorax is seen. The cardiomediastinal silhouette is normal in size. No acute osseous abnormalities are identified. IMPRESSION: Persistent right midlung linear opacity could reflect pneumonia. Retrocardiac opacity is improved. Followup PA and lateral chest X-ray is recommended in 3-4 weeks following trial of antibiotic therapy to ensure resolution and exclude underlying malignancy. Electronically Signed   By: Roanna Raider M.D.   On: 06/16/2017 06:40    EKG: Sinus tachycardia  ASSESSMENT AND PLAN:   1.  Staphylococcus aureus bacteremia 2.  History of SVT, status post catheter ablation  Recommendations  1.  Agree with current therapy 2.  Proceed with transesophageal echocardiogram to rule out subacute bacterial endocarditis.  The risks, benefits alternatives of TEE were explained to the patient and informed written consent was obtained.  Signed: Marcina Millard MD,PhD, [facility] 06/18/2017, 1:51 PM

## 2017-06-18 NOTE — Plan of Care (Signed)
  Progressing Education: Knowledge of General Education information will improve 06/18/2017 1948 - Progressing by Luretha MurphyMiles, Harvel Meskill, RN Fluid Volume: Hemodynamic stability will improve 06/18/2017 1948 - Progressing by Luretha MurphyMiles, Seyed Heffley, RN Clinical Measurements: Diagnostic test results will improve 06/18/2017 1948 - Progressing by Luretha MurphyMiles, Reily Treloar, RN Signs and symptoms of infection will decrease 06/18/2017 1948 - Progressing by Luretha MurphyMiles, Tiny Rietz, RN Respiratory: Ability to maintain adequate ventilation will improve 06/18/2017 1948 - Progressing by Luretha MurphyMiles, Lynetta Tomczak, RN

## 2017-06-18 NOTE — Progress Notes (Signed)
[employer] [facility] [place] at [facility]                                                                                                                                                                                  Patient Demographics   Jeremy Humphrey, is a 46 y.o. male, DOB - September 03, 1970, ZOX:[id number]  Admit date - 06/16/2017   Admitting Physician Arnaldo Natal, MD  Outpatient Primary MD for the patient is Patient, No Pcp Per   LOS - 2  Subjective: Patient reports that his pain in the right chest is better denies any shortness of breath No further fevers when he remained only advised to go there because yesterday and then since with no start allergy she like while here and going home here trouble sneaking Mickey sneaking Lenard Galloway is a sneaky Zettie Pho very smart   Review of Systems:   CONSTITUTIONAL: No documented fever. No fatigue, weakness. No weight gain, no weight loss.  EYES: No blurry or double vision.  ENT: No tinnitus. No postnasal drip. No redness of the oropharynx.  RESPIRATORY: No cough, no wheeze, no hemoptysis. No dyspnea.  CARDIOVASCULAR: No chest pain. No orthopnea. No palpitations. No syncope.  GASTROINTESTINAL: No nausea, no vomiting or diarrhea. No abdominal pain. No melena or hematochezia.  GENITOURINARY: No dysuria or hematuria.  ENDOCRINE: No polyuria or nocturia. No heat or cold intolerance.  HEMATOLOGY: No anemia. No bruising. No bleeding.  INTEGUMENTARY: No rashes. No lesions.  MUSCULOSKELETAL: No arthritis. No swelling. No gout. Some right shoulder pain as well as right-sided chest pain NEUROLOGIC: No numbness, tingling, or ataxia. No seizure-type activity.  PSYCHIATRIC: No anxiety. No insomnia. No ADD.    Vitals:   Vitals:   06/18/17 0440 06/18/17 0529 06/18/17 0837 06/18/17 1402  BP: 135/72  129/79 127/78  Pulse: 97  88   Resp: 16  20   Temp: 98.2 F (36.8 C)  98.6 F (37 C) 98 F (36.7 C)  TempSrc: Oral  Oral Oral  SpO2: (!)  86%  95%   Weight:  210 lb 8 oz (95.5 kg)    Height:        Wt Readings from Last 3 Encounters:  06/18/17 210 lb 8 oz (95.5 kg)  06/15/17 200 lb (90.7 kg)  06/12/17 204 lb 9.6 oz (92.8 kg)     Intake/Output Summary (Last 24 hours) at 06/18/2017 1446 Last data filed at 06/18/2017 1320 Gross per 24 hour  Intake 4871.83 ml  Output -  Net 4871.83 ml    Physical Exam:   GENERAL: Pleasant-appearing in no apparent distress.  HEAD, EYES, EARS, NOSE AND THROAT: Atraumatic, normocephalic. Extraocular muscles are intact.  Pupils equal and reactive to light. Sclerae anicteric. No conjunctival injection. No oro-pharyngeal erythema.  NECK: Supple. There is no jugular venous distention. No bruits, no lymphadenopathy, no thyromegaly.  HEART: Regular rate and rhythm,. No murmurs, no rubs, no clicks.  LUNGS: Clear to auscultation bilaterally. No rales or rhonchi. No wheezes.  ABDOMEN: Soft, flat, nontender, nondistended. Has good bowel sounds. No hepatosplenomegaly appreciated.  EXTREMITIES: No evidence of any cyanosis, clubbing, or peripheral edema.  +2 pedal and radial pulses bilaterally.  NEUROLOGIC: The patient is alert, awake, and oriented x3 with no focal motor or sensory deficits appreciated bilaterally.  SKIN: Right chest some erythema near the sternal joint Psych: Not anxious, depressed LN: No inguinal LN enlargement    Antibiotics   Anti-infectives (From admission, onward)   Start     Dose/Rate Route Frequency Ordered Stop   06/16/17 1400  ceFAZolin (ANCEF) IVPB 2g/100 mL premix  Status:  Discontinued     2 g 200 mL/hr over 30 Minutes Intravenous Every 8 hours 06/16/17 0722 06/16/17 1019   06/16/17 1400  ceFAZolin (ANCEF) 2 g in dextrose 5 % 100 mL IVPB     2 g 200 mL/hr over 30 Minutes Intravenous Every 8 hours 06/16/17 1019     06/16/17 0600  ceFAZolin (ANCEF) 2 g in dextrose 5 % 100 mL IVPB     2 g 200 mL/hr over 30 Minutes Intravenous  Once 06/16/17 0550 06/16/17 0641    06/16/17 0545  ceFAZolin (ANCEF) IVPB 2g/100 mL premix  Status:  Discontinued     2 g 200 mL/hr over 30 Minutes Intravenous  Once 06/16/17 0542 06/16/17 0549      Medications   Scheduled Meds: . docusate sodium  100 mg Oral BID  . enoxaparin (LOVENOX) injection  40 mg Subcutaneous Q24H  . pravastatin  20 mg Oral q1800   Continuous Infusions: . ceFAZolin (ANCEF) IVPB (doses >1 g) 2 g (06/18/17 1434)   PRN Meds:.acetaminophen **OR** acetaminophen, cyclobenzaprine, diazepam, ibuprofen, morphine injection, ondansetron **OR** ondansetron (ZOFRAN) IV, traMADol   Data Review:   Micro Results Recent Results (from the past 240 hour(s))  Blood culture (routine x 2)     Status: Abnormal   Collection Time: 06/15/17  7:05 AM  Result Value Ref Range Status   Specimen Description BLOOD LT FOREARM  Final   Special Requests   Final    BOTTLES DRAWN AEROBIC AND ANAEROBIC Blood Culture results may not be optimal due to an excessive volume of blood received in culture bottles   Culture  Setup Time   Final    GRAM POSITIVE COCCI IN BOTH AEROBIC AND ANAEROBIC BOTTLES CRITICAL RESULT CALLED TO, READ BACK BY AND VERIFIED WITH: MATT MCBANE AT 2220 ON 06/15/17 RWW    Culture (A)  Final    STAPHYLOCOCCUS AUREUS SUSCEPTIBILITIES PERFORMED ON PREVIOUS CULTURE WITHIN THE LAST 5 DAYS. Performed at [facility], [address], [place], [place] [address]    Report Status 06/18/2017 FINAL  Final  Blood culture (routine x 2)     Status: Abnormal   Collection Time: 06/15/17  7:05 AM  Result Value Ref Range Status   Specimen Description BLOOD LT UPPER ARM  Final   Special Requests   Final    BOTTLES DRAWN AEROBIC AND ANAEROBIC Blood Culture results may not be optimal due to an excessive volume of blood received in culture bottles   Culture  Setup Time   Final    GRAM POSITIVE COCCI IN BOTH  AEROBIC AND ANAEROBIC BOTTLES CRITICAL RESULT CALLED TO, READ BACK BY AND VERIFIED WITH: MATT MCBANE AT 2220 ON  06/15/17 RWW    Culture STAPHYLOCOCCUS AUREUS (A)  Final   Report Status 06/18/2017 FINAL  Final   Organism ID, Bacteria STAPHYLOCOCCUS AUREUS  Final      Susceptibility   Staphylococcus aureus - MIC*    CIPROFLOXACIN <=0.5 SENSITIVE Sensitive     ERYTHROMYCIN <=0.25 SENSITIVE Sensitive     GENTAMICIN <=0.5 SENSITIVE Sensitive     OXACILLIN <=0.25 SENSITIVE Sensitive     TETRACYCLINE <=1 SENSITIVE Sensitive     VANCOMYCIN <=0.5 SENSITIVE Sensitive     TRIMETH/SULFA <=10 SENSITIVE Sensitive     CLINDAMYCIN <=0.25 SENSITIVE Sensitive     RIFAMPIN <=0.5 SENSITIVE Sensitive     Inducible Clindamycin NEGATIVE Sensitive     * STAPHYLOCOCCUS AUREUS  Blood Culture ID Panel (Reflexed)     Status: Abnormal   Collection Time: 06/15/17  7:05 AM  Result Value Ref Range Status   Enterococcus species NOT DETECTED NOT DETECTED Final   Listeria monocytogenes NOT DETECTED NOT DETECTED Final   Staphylococcus species DETECTED (A) NOT DETECTED Final    Comment: CRITICAL RESULT CALLED TO, READ BACK BY AND VERIFIED WITH: MATT MCBANE AT 2220 ON 06/15/17 RWW    Staphylococcus aureus DETECTED (A) NOT DETECTED Final    Comment: Methicillin (oxacillin) susceptible Staphylococcus aureus (MSSA). Preferred therapy is anti staphylococcal beta lactam antibiotic (Cefazolin or Nafcillin), unless clinically contraindicated. CRITICAL RESULT CALLED TO, READ BACK BY AND VERIFIED WITH: MATT MCBANE AT 2220 ON 06/15/17/RWW    Methicillin resistance NOT DETECTED NOT DETECTED Final   Streptococcus species NOT DETECTED NOT DETECTED Final   Streptococcus agalactiae NOT DETECTED NOT DETECTED Final   Streptococcus pneumoniae NOT DETECTED NOT DETECTED Final   Streptococcus pyogenes NOT DETECTED NOT DETECTED Final   Acinetobacter baumannii NOT DETECTED NOT DETECTED Final   Enterobacteriaceae species NOT DETECTED NOT DETECTED Final   Enterobacter cloacae complex NOT DETECTED NOT DETECTED Final   Escherichia coli NOT DETECTED  NOT DETECTED Final   Klebsiella oxytoca NOT DETECTED NOT DETECTED Final   Klebsiella pneumoniae NOT DETECTED NOT DETECTED Final   Proteus species NOT DETECTED NOT DETECTED Final   Serratia marcescens NOT DETECTED NOT DETECTED Final   Haemophilus influenzae NOT DETECTED NOT DETECTED Final   Neisseria meningitidis NOT DETECTED NOT DETECTED Final   Pseudomonas aeruginosa NOT DETECTED NOT DETECTED Final   Candida albicans NOT DETECTED NOT DETECTED Final   Candida glabrata NOT DETECTED NOT DETECTED Final   Candida krusei NOT DETECTED NOT DETECTED Final   Candida parapsilosis NOT DETECTED NOT DETECTED Final   Candida tropicalis NOT DETECTED NOT DETECTED Final  Urine culture     Status: None   Collection Time: 06/16/17 12:49 PM  Result Value Ref Range Status   Specimen Description URINE, RANDOM  Final   Special Requests NONE  Final   Culture   Final    NO GROWTH Performed at [facility], [address] [place], [place] [address]    Report Status 06/17/2017 FINAL  Final  Culture, blood (single) w Reflex to ID Panel     Status: None (Preliminary result)   Collection Time: 06/17/17  3:44 PM  Result Value Ref Range Status   Specimen Description BLOOD Blood Culture adequate volume  Final   Special Requests RIGHT ANTECUBITAL  Final   Culture NO GROWTH < 24 HOURS  Final   Report Status PENDING  Incomplete  Radiology Reports Dg Chest 2 View  Result Date: 06/15/2017 CLINICAL DATA:  Right shoulder pain beginning 3 days ago. Patient denies trauma. Fever for 3 days. EXAM: CHEST  2 VIEW COMPARISON:  None. FINDINGS: There is a somewhat tubular and platelike opacity in the right base. There appears to be streaky opacity in the left retrocardiac region. No pneumothorax. The heart, hila, mediastinum are normal. No nodules or masses. IMPRESSION: Bibasilar pulmonary opacities. The streaky and somewhat platelike nature suggests the possibility of atelectasis. However, given the history of fever,  pneumonia is not excluded. Recommend follow-up to resolution. Electronically Signed   By: Gerome Samavid  Williams III M.D   On: 06/15/2017 07:49   Dg Shoulder Right  Result Date: 06/15/2017 CLINICAL DATA:  Pain without trauma EXAM: RIGHT SHOULDER - 2+ VIEW COMPARISON:  None. FINDINGS: There is no evidence of fracture or dislocation. There is no evidence of arthropathy or other focal bone abnormality. Soft tissues are unremarkable. IMPRESSION: Negative. Electronically Signed   By: Gerome Samavid  Williams III M.D   On: 06/15/2017 07:51   Ct Soft Tissue Neck W Contrast  Result Date: 06/15/2017 CLINICAL DATA:  46 year old male with fever, right neck and shoulder pain for 3 days. EXAM: CT NECK WITH CONTRAST TECHNIQUE: Multidetector CT imaging of the neck was performed using the standard protocol following the bolus administration of intravenous contrast. CONTRAST:  75mL ISOVUE-300 IOPAMIDOL (ISOVUE-300) INJECTION 61% COMPARISON:  Chest radiographs 0724 hours today. FINDINGS: Pharynx and larynx: Appears to be physiologic. Small volume retained secretions suspected in the vallecula and hypopharynx. No tonsillar enlargement or hyper enhancement. The epiglottis appears within normal limits. Negative parapharyngeal and retropharyngeal spaces. Salivary glands: Sublingual space, submandibular glands, and parotid glands are within normal limits. Thyroid: Negative. Lymph nodes: Appears symmetric and within normal limits. Vascular: Major vascular structures in the neck and at the skullbase appear patent and normal. Limited intracranial: Negative. Visualized orbits: Negative. Mastoids and visualized paranasal sinuses: Scattered bilateral ethmoid and sphenoid sinus opacification and mucosal thickening with some bubbly opacity. The maxillary sinuses are spared (Small left anterior maxillary mucous retention cyst). Tympanic cavities and mastoids are clear. Rightward nasal septal deviation and spurring. Trace bubbly opacity in the left nasal  cavity. Skeleton: Bilateral C2-C3 posterior element ankylosis. Severe left C3-C4 facet arthropathy and bulky leftward endplate spurring. No acute osseous abnormality identified. Upper chest: Normal visualized superior mediastinum. The lung apices are clear. Visible axillary lymph nodes are normal. IMPRESSION: 1. No definite acute or inflammatory process in the neck. 2. Scattered ethmoid and sphenoid paranasal sinusitis. 3. 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 Electronically Signed   By: Odessa FlemingH  Hall M.D.   On: 06/15/2017 08:41   Ct Chest Wo Contrast  Result Date: 06/17/2017 CLINICAL DATA:  Fever EXAM: CT CHEST WITHOUT CONTRAST TECHNIQUE: Multidetector CT imaging of the chest was performed following the standard protocol without IV contrast. COMPARISON:  Chest x-ray 06/16/2017. FINDINGS: Cardiovascular: Heart size normal. Trace pericardial fluid noted. No thoracic aortic aneurysm. Mediastinum/Nodes: No mediastinal lymphadenopathy. Soft tissue fullness noted in the hilar regions bilaterally, but not well evaluated on this noncontrast exam. The esophagus has normal imaging features. There is no axillary lymphadenopathy. Lungs/Pleura: Interlobular septal thickening noted in the lung apices. Patchy ill-defined nodularity is seen in the central lungs bilaterally, right greater than left having a tree-in-bud centrilobular distribution in some regions. Features are more confluent in the right lower lobe with bite lateral lower lobe dependent collapse/consolidation. Small bilateral pleural  effusions are associated. Upper Abdomen: 15 mm hypoattenuating lesion is identified in the left liver, incompletely characterize but potentially a cyst. Musculoskeletal: Edema seen in the subcutaneous tissues of the anterior chest wall, anterior to the sternum and in the right paramidline chest. This is associated with some retrosternal edema, progressive since neck  CT of 06/15/2017. No focal fluid collection is evident. Features presumably related to the right pectoralis major tear seen on MRI of 06/17/2017. IMPRESSION: 1. Interlobular septal thickening with patchy ill-defined central ground-glass nodularity in both lungs, right greater than left. There is more confluent collapse/consolidation in the lower lobes bilaterally. Overall imaging features are not typical for septic emboli. Bilateral infectious/inflammatory etiology suspected including broncho pneumonia and atypical infection. Pulmonary edema or hemorrhage could present similarly. 2. Small bilateral pleural effusions with bilateral lower lobe collapse/consolidation dependently. 3. Edema in the medial aspect of the anterior right chest wall with associated retrosternal edema, progressive since 06/15/2017. This is presumably related to the pectoralis major injury identified on recent MRI. Electronically Signed   By: Kennith Center M.D.   On: 06/17/2017 19:49   Mr Elvera Bicker ON Contrast  Result Date: 06/17/2017 CLINICAL DATA:  Right-sided chest pain possibly associated with shoveling snow. Patient also has fevers. EXAM: MR STERNUM WITHOUT AND WITH CONTRAST TECHNIQUE: Multiplanar multisequence imaging of the chest wall was performed pre and post administration of IV contrast. CONTRAST:  19 cc MultiHance COMPARISON:  None. FINDINGS: Extensive edema like signal abnormality and enhancement in the right pectoralis major muscle medially. There is also fluid and inflammation in the subcutaneous soft tissues overlying the muscle and between the 2 pectoralis muscles in the midline. Findings are most consistent with a muscle strain and partial tearing. No avulsion from the sternal attachment. The sternum and sternoclavicular joints appear normal. No findings to suggest septic arthritis or osteomyelitis. IMPRESSION: 1. Right pectoralis major muscle tear medially. 2. No findings to suggest septic arthritis or osteomyelitis  involving the sternum or sternoclavicular joints. Electronically Signed   By: Rudie Meyer M.D.   On: 06/17/2017 11:43   Mr Shoulder Right W Wo Contrast  Result Date: 06/17/2017 CLINICAL DATA:  Right shoulder pain with fever. EXAM: MRI OF THE RIGHT SHOULDER WITHOUT AND WITH CONTRAST TECHNIQUE: Multiplanar, multisequence MR imaging of the right shoulder was performed before and after the administration of intravenous contrast. CONTRAST:  19 cc MultiHance COMPARISON:  Radiographs 06/15/2017 FINDINGS: Examination is somewhat limited by patient motion. Rotator cuff: Mild to moderate rotator cuff tendinopathy/tendinosis but no definite partial or full-thickness rotator cuff tear. Muscles:  No significant findings.  No muscle tear or myositis. Biceps long head:  Intact Acromioclavicular Joint: Mild degenerative changes. Type to acromion. No significant lateral downsloping or undersurface spurring. Glenohumeral Joint: No significant degenerative changes. No joint effusion. No synovitis or abnormal enhancement to suggest septic arthritis. Labrum:  No obvious labral tears. Bones:  No acute bony findings. Other: Mild subacromial/subdeltoid bursitis. IMPRESSION: 1. Mild to moderate rotator cuff tendinopathy/tendinosis but no definite partial or full-thickness tear. 2. No muscle tear or myositis. 3. Normal glenohumeral joint. No findings to suggest septic arthritis. 4. Mild subacromial/subdeltoid bursitis. Electronically Signed   By: Rudie Meyer M.D.   On: 06/17/2017 11:38   Dg Chest Port 1 View  Result Date: 06/16/2017 CLINICAL DATA:  Code sepsis. EXAM: PORTABLE CHEST 1 VIEW COMPARISON:  Chest radiograph performed 06/15/2017 FINDINGS: Persistent right midlung linear opacity is noted. This could reflect pneumonia. Retrocardiac opacity has improved. No pleural effusion or pneumothorax is  seen. The cardiomediastinal silhouette is normal in size. No acute osseous abnormalities are identified. IMPRESSION: Persistent  right midlung linear opacity could reflect pneumonia. Retrocardiac opacity is improved. Followup PA and lateral chest X-ray is recommended in 3-4 weeks following trial of antibiotic therapy to ensure resolution and exclude underlying malignancy. Electronically Signed   By: Roanna RaiderJeffery  Chang M.D.   On: 06/16/2017 06:40     CBC Recent Labs  Lab 06/15/17 0651 06/16/17 0548 06/17/17 0429  WBC 8.5 7.5 4.6  HGB 13.8 13.4 11.9*  HCT 39.5* 39.1* 34.8*  PLT 205 194 145*  MCV 88.2 89.4 89.6  MCH 30.9 30.6 30.7  MCHC 35.0 34.3 34.3  RDW 12.6 12.6 13.0  LYMPHSABS 0.7* 0.3*  --   MONOABS 1.0 0.4  --   EOSABS 0.0 0.0  --   BASOSABS 0.0 0.0  --     Chemistries  Recent Labs  Lab 06/15/17 0651 06/16/17 0548 06/17/17 0429  NA 134* 136 139  K 3.2* 3.2* 3.4*  CL 103 102 109  CO2 21* 25 24  GLUCOSE 140* 130* 116*  BUN 13 14 12   CREATININE 1.08 1.13 0.89  CALCIUM 8.6* 8.6* 7.5*  AST 38 47*  --   ALT 53 70*  --   ALKPHOS 63 63  --   BILITOT 1.6* 1.7*  --    ------------------------------------------------------------------------------------------------------------------ estimated creatinine clearance is 113.7 mL/min (by C-G formula based on SCr of 0.89 mg/dL). ------------------------------------------------------------------------------------------------------------------ No results for input(s): HGBA1C in the last 72 hours. ------------------------------------------------------------------------------------------------------------------ No results for input(s): CHOL, HDL, LDLCALC, TRIG, CHOLHDL, LDLDIRECT in the last 72 hours. ------------------------------------------------------------------------------------------------------------------ No results for input(s): TSH, T4TOTAL, T3FREE, THYROIDAB in the last 72 hours.  Invalid input(s): FREET3 ------------------------------------------------------------------------------------------------------------------ No results for input(s):  VITAMINB12, FOLATE, FERRITIN, TIBC, IRON, RETICCTPCT in the last 72 hours.  Coagulation profile Recent Labs  Lab 06/16/17 0548  INR 1.16    No results for input(s): DDIMER in the last 72 hours.  Cardiac Enzymes Recent Labs  Lab 06/15/17 0651 06/16/17 0548  TROPONINI <0.03 <0.03   ------------------------------------------------------------------------------------------------------------------ Invalid input(s): POCBNP    Assessment & Plan   1.    MSSA bacteremia Continue therapy with IV antibiotics ID following the patient Mri of right shoulder no evidence infection.  CT scan shows possible pneumonia continue IV antibiotics TEE tomorrow 2.  Right sided chest pain shoulder pain Inflammatory in nature no evidence of joint infection Continue anti-inflammatories 3.hypokalemia we will replace potassium 4. Obesity: BMI is 30.2; encouraged healthy diet and exercise. 5.  DVT prophylaxis: Lovenox 6.  GI prophylaxis: None       Code Status Orders  (From admission, onward)        Start     Ordered   06/16/17 0703  Full code  Continuous     06/16/17 0702    Code Status History    Date Active Date Inactive Code Status Order ID Comments User Context   This patient has a current code status but no historical code status.           Consults orthopedics, infectious disease  DVT Prophylaxis  Lovenox  Lab Results  Component Value Date   PLT 145 (L) 06/17/2017     Time Spent in minutes 35 minutes greater than 50% of time spent in care coordination and counseling patient regarding the condition and plan of care.   Auburn BilberryPATEL, Airiel Oblinger M.D on 06/18/2017 at 2:46 PM  Between 7am to 6pm - Pager - [phone]  After 6pm go  to [email or web address] - password [email or web address] [place] [facility]   Office  [phone]

## 2017-06-19 ENCOUNTER — Inpatient Hospital Stay
Admit: 2017-06-19 | Discharge: 2017-06-19 | Disposition: A | Discharge status: Home / Self Care | Payer: BLUE CROSS/BLUE SHIELD | Attending: Cardiology | Admitting: Cardiology

## 2017-06-19 ENCOUNTER — Encounter
Admission: EM | Disposition: A | Discharge status: Home Health | Payer: Self-pay | Source: Home / Self Care | Attending: Internal Medicine

## 2017-06-19 HISTORY — PX: TEE WITHOUT CARDIOVERSION: SHX5443

## 2017-06-19 LAB — BASIC METABOLIC PANEL
Anion gap: 9 (ref 5–15)
BUN: 10 mg/dL (ref 6–20)
CO2: 26 mmol/L (ref 22–32)
Calcium: 8.1 mg/dL — ABNORMAL LOW (ref 8.9–10.3)
Chloride: 105 mmol/L (ref 101–111)
Creatinine, Ser: 0.93 mg/dL (ref 0.61–1.24)
GFR calc Af Amer: >60 mL/min (ref >60)
GLUCOSE: 101 mg/dL — AB (ref 65–99)
POTASSIUM: 3.6 mmol/L (ref 3.5–5.1)
Sodium: 140 mmol/L (ref 135–145)

## 2017-06-19 SURGERY — ECHOCARDIOGRAM, TRANSESOPHAGEAL
Anesthesia: Moderate Sedation

## 2017-06-19 MED ORDER — FENTANYL CITRATE (PF) 100 MCG/2ML IJ SOLN
INTRAMUSCULAR | Status: AC | PRN
  Administered 2017-06-19: 50 ug via INTRAVENOUS
  Administered 2017-06-19 (×3): 25 ug via INTRAVENOUS

## 2017-06-19 MED ORDER — MIDAZOLAM HCL 2 MG/2ML IJ SOLN
INTRAMUSCULAR | Status: AC
  Filled 2017-06-19: qty 2

## 2017-06-19 MED ORDER — FENTANYL CITRATE (PF) 100 MCG/2ML IJ SOLN
INTRAMUSCULAR | Status: AC
  Filled 2017-06-19: qty 2

## 2017-06-19 MED ORDER — LIDOCAINE VISCOUS 2 % MT SOLN
OROMUCOSAL | Status: AC
  Filled 2017-06-19: qty 15

## 2017-06-19 MED ORDER — DIAZEPAM 5 MG PO TABS: 5.0000 mg | ORAL_TABLET | Freq: Three times a day (TID) | ORAL | 0 refills | Status: DC | PRN

## 2017-06-19 MED ORDER — MIDAZOLAM HCL 2 MG/2ML IJ SOLN
INTRAMUSCULAR | Status: AC | PRN
  Administered 2017-06-19: 1 mg via INTRAVENOUS

## 2017-06-19 MED ORDER — BUTAMBEN-TETRACAINE-BENZOCAINE 2-2-14 % EX AERO
INHALATION_SPRAY | CUTANEOUS | Status: AC
  Filled 2017-06-19: qty 5

## 2017-06-19 MED ORDER — OXYCODONE-ACETAMINOPHEN 5-325 MG PO TABS: 1.0000 | ORAL_TABLET | Freq: Four times a day (QID) | ORAL | 0 refills | Status: AC | PRN

## 2017-06-19 MED ORDER — ACETAMINOPHEN 325 MG PO TABS: 650.0000 mg | ORAL_TABLET | Freq: Four times a day (QID) | ORAL | Status: DC | PRN

## 2017-06-19 MED ORDER — IBUPROFEN 800 MG PO TABS: 800.0000 mg | ORAL_TABLET | Freq: Three times a day (TID) | ORAL | 0 refills | Status: DC | PRN

## 2017-06-19 MED ORDER — MIDAZOLAM HCL 5 MG/5ML IJ SOLN
INTRAMUSCULAR | Status: AC | PRN
  Administered 2017-06-19: 2 mg via INTRAVENOUS
  Administered 2017-06-19 (×3): 1 mg via INTRAVENOUS

## 2017-06-19 MED ORDER — MIDAZOLAM HCL 5 MG/5ML IJ SOLN
INTRAMUSCULAR | Status: AC
  Filled 2017-06-19: qty 5

## 2017-06-19 MED ORDER — DEXTROSE 5 % IV SOLN
2.0000 g | Freq: Once | INTRAVENOUS | Status: AC
  Administered 2017-06-19: 15:00:00 2 g via INTRAVENOUS
  Filled 2017-06-19: qty 2

## 2017-06-19 MED ORDER — SODIUM CHLORIDE 0.9% FLUSH: 10.0000 mL | Freq: Two times a day (BID) | INTRAVENOUS | Status: DC

## 2017-06-19 MED ORDER — SODIUM CHLORIDE 0.9% FLUSH: 10.0000 mL | INTRAVENOUS | Status: DC | PRN

## 2017-06-19 NOTE — Procedures (Signed)
    TRANSESOPHAGEAL ECHOCARDIOGRAM   NAME:  Jeremy AmericanJoseph A Keeling Jr.   MRN: [id number] DOB:  04/25/1971   ADMIT DATE: 06/16/2017  INDICATIONS:   PROCEDURE:   Informed consent was obtained prior to the procedure. The risks, benefits and alternatives for the procedure were discussed and the patient comprehended these risks.  Risks include, but are not limited to, cough, sore throat, vomiting, nausea, somnolence, esophageal and stomach trauma or perforation, bleeding, low blood pressure, aspiration, pneumonia, infection, trauma to the teeth and death.    After a procedural time-out, the patient was given 4 mg versed and 75 mcg fentanyl for moderate sedation.  The oropharynx was anesthetized 1 cc of topical 1% viscous lidocaine.  The transesophageal probe was inserted in the esophagus and stomach without difficulty and multiple views were obtained.    COMPLICATIONS:    There were no immediate complications.  FINDINGS:  LEFT VENTRICLE: EF = 55%. No regional wall motion abnormalities.  RIGHT VENTRICLE: Normal size and function.   LEFT ATRIUM: Normal size.   LEFT ATRIAL APPENDAGE: No thrombus.   RIGHT ATRIUM: Normal size  AORTIC VALVE:  [place]. Trace ai. No vegetation  MITRAL VALVE:    GamingBus.huNormal.mild prolapse. Mild mr. No vegetation  TRICUSPID VALVE: Normal. No significant tr  PULMONIC VALVE: Grossly normal.  INTERATRIAL SEPTUM: No PFO or ASD. Agitated saline contrast injected. No shunt PERICARDIUM: No effusion  DESCENDING AORTA:   CONCLUSION: No evidence of vegetations

## 2017-06-19 NOTE — Care Management Note (Signed)
Case Management Note  Patient Details  Name: Jeremy AmericanJoseph A Butterbaugh Jr. MRN: [id number] Date of Birth: 08/09/1970  Subjective/Objective:   PICC line pending. IV antibiotics ordered by Dr. Sampson GoonFitzgerald.                  Action/Plan:   Expected Discharge Date:  06/19/17               Expected Discharge Plan:  Home w Home Health Services  In-House Referral:     Discharge planning Services  CM Consult  Post Acute Care Choice:  Home Health Choice offered to:  Patient  DME Arranged:    DME Agency:     HH Arranged:  RN HH Agency:  [facility]  Status of Service:  Completed, signed off  If discussed at Long Length of Stay Meetings, dates discussed:    Additional Comments:  Marily MemosLisa M Makeyla Govan, RN 06/19/2017, 2:15 PM

## 2017-06-19 NOTE — Discharge Summary (Signed)
[facility] - [place] at [facility]  Jeremy A Capano Jr., 46 y.o., DOB 04/25/1971, MRN [id number]. Admission date: 06/16/2017 Discharge Date 06/19/2017 Primary MD Patient, No Pcp Per Admitting Physician Arnaldo NatalMichael S Diamond, MD  Admission Diagnosis  Bacteremia [R78.81] Sepsis due to methicillin susceptible Staphylococcus aureus (HCC) [A41.01]  Discharge Diagnosis   Active Problems:  MSSA bacteremia Possible pneumonia Right-sided pectoralis muscle strain History of SVT      Hospital Course  The patient with past medical history of SVT status post ablation presents to the emergency department complaining of fever.  The patient states he had a maximum temperature of 102.5 F at home. Patient also had significant pain in his right shoulder in the right chest area. He also was noted to have positive blood cultures from recent ER visit. He was admitted for MSSA bacteremia. He was seen in consultation by infectious disease. There was concern initially for possible right shoulder septic joint. However MRI ruled out. Patient's MRI did show that there was a possible right-sided pectoralis muscle to diminished drain. Patient was seen by orthopedic surgery given some anti-inflammatory medications. Patient has been arranged to have IV antibiotics at home. He is not febrile anymore.              Consults  ID, orthopedic surgery  Significant Tests:  See full reports for all details     Dg Chest 2 View  Result Date: 06/15/2017 CLINICAL DATA:  Right shoulder pain beginning 3 days ago. Patient denies trauma. Fever for 3 days. EXAM: CHEST  2 VIEW COMPARISON:  None. FINDINGS: There is a somewhat tubular and platelike opacity in the right base. There appears to be streaky opacity in the left retrocardiac region. No pneumothorax. The heart, hila, mediastinum are normal. No nodules or masses. IMPRESSION: Bibasilar pulmonary opacities. The streaky and somewhat platelike nature suggests  the possibility of atelectasis. However, given the history of fever, pneumonia is not excluded. Recommend follow-up to resolution. Electronically Signed   By: Jeremy Samavid  Williams Humphrey M.D   On: 06/15/2017 07:49   Dg Shoulder Right  Result Date: 06/15/2017 CLINICAL DATA:  Pain without trauma EXAM: RIGHT SHOULDER - 2+ VIEW COMPARISON:  None. FINDINGS: There is no evidence of fracture or dislocation. There is no evidence of arthropathy or other focal bone abnormality. Soft tissues are unremarkable. IMPRESSION: Negative. Electronically Signed   By: Jeremy Samavid  Williams Humphrey M.D   On: 06/15/2017 07:51   Ct Soft Tissue Neck W Contrast  Result Date: 06/15/2017 CLINICAL DATA:  46 year old male with fever, right neck and shoulder pain for 3 days. EXAM: CT NECK WITH CONTRAST TECHNIQUE: Multidetector CT imaging of the neck was performed using the standard protocol following the bolus administration of intravenous contrast. CONTRAST:  75mL ISOVUE-300 IOPAMIDOL (ISOVUE-300) INJECTION 61% COMPARISON:  Chest radiographs 0724 hours today. FINDINGS: Pharynx and larynx: Appears to be physiologic. Small volume retained secretions suspected in the vallecula and hypopharynx. No tonsillar enlargement or hyper enhancement. The epiglottis appears within normal limits. Negative parapharyngeal and retropharyngeal spaces. Salivary glands: Sublingual space, submandibular glands, and parotid glands are within normal limits. Thyroid: Negative. Lymph nodes: Appears symmetric and within normal limits. Vascular: Major vascular structures in the neck and at the skullbase appear patent and normal. Limited intracranial: Negative. Visualized orbits: Negative. Mastoids and visualized paranasal sinuses: Scattered bilateral ethmoid and sphenoid sinus opacification and mucosal thickening with some bubbly opacity. The maxillary sinuses are spared (Small left anterior maxillary mucous retention cyst). Tympanic cavities and mastoids are  clear. Rightward nasal  septal deviation and spurring. Trace bubbly opacity in the left nasal cavity. Skeleton: Bilateral C2-C3 posterior element ankylosis. Severe left C3-C4 facet arthropathy and bulky leftward endplate spurring. No acute osseous abnormality identified. Upper chest: Normal visualized superior mediastinum. The lung apices are clear. Visible axillary lymph nodes are normal. IMPRESSION: 1. No definite acute or inflammatory process in the neck. 2. Scattered ethmoid and sphenoid paranasal sinusitis. 3. 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 Electronically Signed   By: Odessa Fleming M.D.   On: 06/15/2017 08:41   Ct Chest Wo Contrast  Result Date: 06/17/2017 CLINICAL DATA:  Fever EXAM: CT CHEST WITHOUT CONTRAST TECHNIQUE: Multidetector CT imaging of the chest was performed following the standard protocol without IV contrast. COMPARISON:  Chest x-ray 06/16/2017. FINDINGS: Cardiovascular: Heart size normal. Trace pericardial fluid noted. No thoracic aortic aneurysm. Mediastinum/Nodes: No mediastinal lymphadenopathy. Soft tissue fullness noted in the hilar regions bilaterally, but not well evaluated on this noncontrast exam. The esophagus has normal imaging features. There is no axillary lymphadenopathy. Lungs/Pleura: Interlobular septal thickening noted in the lung apices. Patchy ill-defined nodularity is seen in the central lungs bilaterally, right greater than left having a tree-in-bud centrilobular distribution in some regions. Features are more confluent in the right lower lobe with bite lateral lower lobe dependent collapse/consolidation. Small bilateral pleural effusions are associated. Upper Abdomen: 15 mm hypoattenuating lesion is identified in the left liver, incompletely characterize but potentially a cyst. Musculoskeletal: Edema seen in the subcutaneous tissues of the anterior chest wall, anterior to the sternum and in the right paramidline chest.  This is associated with some retrosternal edema, progressive since neck CT of 06/15/2017. No focal fluid collection is evident. Features presumably related to the right pectoralis major tear seen on MRI of 06/17/2017. IMPRESSION: 1. Interlobular septal thickening with patchy ill-defined central ground-glass nodularity in both lungs, right greater than left. There is more confluent collapse/consolidation in the lower lobes bilaterally. Overall imaging features are not typical for septic emboli. Bilateral infectious/inflammatory etiology suspected including broncho pneumonia and atypical infection. Pulmonary edema or hemorrhage could present similarly. 2. Small bilateral pleural effusions with bilateral lower lobe collapse/consolidation dependently. 3. Edema in the medial aspect of the anterior right chest wall with associated retrosternal edema, progressive since 06/15/2017. This is presumably related to the pectoralis major injury identified on recent MRI. Electronically Signed   By: Kennith Center M.D.   On: 06/17/2017 19:49   Mr Elvera Bicker RU Contrast  Result Date: 06/17/2017 CLINICAL DATA:  Right-sided chest pain possibly associated with shoveling snow. Patient also has fevers. EXAM: MR STERNUM WITHOUT AND WITH CONTRAST TECHNIQUE: Multiplanar multisequence imaging of the chest wall was performed pre and post administration of IV contrast. CONTRAST:  19 cc MultiHance COMPARISON:  None. FINDINGS: Extensive edema like signal abnormality and enhancement in the right pectoralis major muscle medially. There is also fluid and inflammation in the subcutaneous soft tissues overlying the muscle and between the 2 pectoralis muscles in the midline. Findings are most consistent with a muscle strain and partial tearing. No avulsion from the sternal attachment. The sternum and sternoclavicular joints appear normal. No findings to suggest septic arthritis or osteomyelitis. IMPRESSION: 1. Right pectoralis major muscle tear  medially. 2. No findings to suggest septic arthritis or osteomyelitis involving the sternum or sternoclavicular joints. Electronically Signed   By: Rudie Meyer M.D.   On: 06/17/2017 11:43   Mr Shoulder Right W Wo Contrast  Result Date: 06/17/2017 CLINICAL DATA:  Right shoulder pain with fever. EXAM: MRI OF THE RIGHT SHOULDER WITHOUT AND WITH CONTRAST TECHNIQUE: Multiplanar, multisequence MR imaging of the right shoulder was performed before and after the administration of intravenous contrast. CONTRAST:  19 cc MultiHance COMPARISON:  Radiographs 06/15/2017 FINDINGS: Examination is somewhat limited by patient motion. Rotator cuff: Mild to moderate rotator cuff tendinopathy/tendinosis but no definite partial or full-thickness rotator cuff tear. Muscles:  No significant findings.  No muscle tear or myositis. Biceps long head:  Intact Acromioclavicular Joint: Mild degenerative changes. Type to acromion. No significant lateral downsloping or undersurface spurring. Glenohumeral Joint: No significant degenerative changes. No joint effusion. No synovitis or abnormal enhancement to suggest septic arthritis. Labrum:  No obvious labral tears. Bones:  No acute bony findings. Other: Mild subacromial/subdeltoid bursitis. IMPRESSION: 1. Mild to moderate rotator cuff tendinopathy/tendinosis but no definite partial or full-thickness tear. 2. No muscle tear or myositis. 3. Normal glenohumeral joint. No findings to suggest septic arthritis. 4. Mild subacromial/subdeltoid bursitis. Electronically Signed   By: Rudie Meyer M.D.   On: 06/17/2017 11:38   Dg Chest Port 1 View  Result Date: 06/16/2017 CLINICAL DATA:  Code sepsis. EXAM: PORTABLE CHEST 1 VIEW COMPARISON:  Chest radiograph performed 06/15/2017 FINDINGS: Persistent right midlung linear opacity is noted. This could reflect pneumonia. Retrocardiac opacity has improved. No pleural effusion or pneumothorax is seen. The cardiomediastinal silhouette is normal in size. No  acute osseous abnormalities are identified. IMPRESSION: Persistent right midlung linear opacity could reflect pneumonia. Retrocardiac opacity is improved. Followup PA and lateral chest X-ray is recommended in 3-4 weeks following trial of antibiotic therapy to ensure resolution and exclude underlying malignancy. Electronically Signed   By: Roanna Raider M.D.   On: 06/16/2017 06:40       Today   Subjective:   Rich Reining  patient feels better  Objective:   Blood pressure (!) 153/90, pulse 96, temperature 99 F (37.2 C), temperature source Oral, resp. rate 18, height 5\' 8"  (1.727 m), weight 204 lb (92.5 kg), SpO2 93 %.  .  Intake/Output Summary (Last 24 hours) at 06/19/2017 1514 Last data filed at 06/19/2017 1123 Gross per 24 hour  Intake 0 ml  Output -  Net 0 ml    Exam VITAL SIGNS: Blood pressure (!) 153/90, pulse 96, temperature 99 F (37.2 C), temperature source Oral, resp. rate 18, height 5\' 8"  (1.727 m), weight 204 lb (92.5 kg), SpO2 93 %.  GENERAL:  46 y.o.-year-old patient lying in the bed with no acute distress.  EYES: Pupils equal, round, reactive to light and accommodation. No scleral icterus. Extraocular muscles intact.  HEENT: Head atraumatic, normocephalic. Oropharynx and nasopharynx clear.  NECK:  Supple, no jugular venous distention. No thyroid enlargement, no tenderness.  LUNGS: Normal breath sounds bilaterally, no wheezing, rales,rhonchi or crepitation. No use of accessory muscles of respiration.  CARDIOVASCULAR: S1, S2 normal. No murmurs, rubs, or gallops.  ABDOMEN: Soft, nontender, nondistended. Bowel sounds present. No organomegaly or mass.  EXTREMITIES: No pedal edema, cyanosis, or clubbing.  NEUROLOGIC: Cranial nerves II through XII are intact. Muscle strength 5/5 in all extremities. Sensation intact. Gait not checked.  PSYCHIATRIC: The patient is alert and oriented x 3.  SKIN: Right chest there is an erythematous rash Data Review     CBC w Diff:  Lab  Results  Component Value Date   WBC 4.6 06/17/2017   HGB 11.9 (L) 06/17/2017   HGB 15.7 10/27/2014   HCT 34.8 (L) 06/17/2017  HCT 47.3 10/27/2014   PLT 145 (L) 06/17/2017   PLT 279 10/27/2014   LYMPHOPCT 4 06/16/2017   MONOPCT 5 06/16/2017   EOSPCT 0 06/16/2017   BASOPCT 0 06/16/2017   CMP:  Lab Results  Component Value Date   NA 140 06/19/2017   NA 141 10/27/2014   K 3.6 06/19/2017   K 3.9 10/27/2014   CL 105 06/19/2017   CL 106 10/27/2014   CO2 26 06/19/2017   CO2 24 10/27/2014   BUN 10 06/19/2017   BUN 16 10/27/2014   CREATININE 0.93 06/19/2017   CREATININE 1.16 10/27/2014   PROT 7.4 06/16/2017   ALBUMIN 3.4 (L) 06/16/2017   BILITOT 1.7 (H) 06/16/2017   ALKPHOS 63 06/16/2017   AST 47 (H) 06/16/2017   ALT 70 (H) 06/16/2017  .  Micro Results Recent Results (from the past 240 hour(s))  Blood culture (routine x 2)     Status: Abnormal   Collection Time: 06/15/17  7:05 AM  Result Value Ref Range Status   Specimen Description BLOOD LT FOREARM  Final   Special Requests   Final    BOTTLES DRAWN AEROBIC AND ANAEROBIC Blood Culture results may not be optimal due to an excessive volume of blood received in culture bottles   Culture  Setup Time   Final    GRAM POSITIVE COCCI IN BOTH AEROBIC AND ANAEROBIC BOTTLES CRITICAL RESULT CALLED TO, READ BACK BY AND VERIFIED WITH: MATT MCBANE AT 2220 ON 06/15/17 RWW    Culture (A)  Final    STAPHYLOCOCCUS AUREUS SUSCEPTIBILITIES PERFORMED ON PREVIOUS CULTURE WITHIN THE LAST 5 DAYS. Performed at [facility], [address] [place], [place] [address]    Report Status 06/18/2017 FINAL  Final  Blood culture (routine x 2)     Status: Abnormal   Collection Time: 06/15/17  7:05 AM  Result Value Ref Range Status   Specimen Description BLOOD LT UPPER ARM  Final   Special Requests   Final    BOTTLES DRAWN AEROBIC AND ANAEROBIC Blood Culture results may not be optimal due to an excessive volume of blood received in culture  bottles   Culture  Setup Time   Final    GRAM POSITIVE COCCI IN BOTH AEROBIC AND ANAEROBIC BOTTLES CRITICAL RESULT CALLED TO, READ BACK BY AND VERIFIED WITH: MATT MCBANE AT 2220 ON 06/15/17 RWW    Culture STAPHYLOCOCCUS AUREUS (A)  Final   Report Status 06/18/2017 FINAL  Final   Organism ID, Bacteria STAPHYLOCOCCUS AUREUS  Final      Susceptibility   Staphylococcus aureus - MIC*    CIPROFLOXACIN <=0.5 SENSITIVE Sensitive     ERYTHROMYCIN <=0.25 SENSITIVE Sensitive     GENTAMICIN <=0.5 SENSITIVE Sensitive     OXACILLIN <=0.25 SENSITIVE Sensitive     TETRACYCLINE <=1 SENSITIVE Sensitive     VANCOMYCIN <=0.5 SENSITIVE Sensitive     TRIMETH/SULFA <=10 SENSITIVE Sensitive     CLINDAMYCIN <=0.25 SENSITIVE Sensitive     RIFAMPIN <=0.5 SENSITIVE Sensitive     Inducible Clindamycin NEGATIVE Sensitive     * STAPHYLOCOCCUS AUREUS  Blood Culture ID Panel (Reflexed)     Status: Abnormal   Collection Time: 06/15/17  7:05 AM  Result Value Ref Range Status   Enterococcus species NOT DETECTED NOT DETECTED Final   Listeria monocytogenes NOT DETECTED NOT DETECTED Final   Staphylococcus species DETECTED (A) NOT DETECTED Final    Comment: CRITICAL RESULT CALLED TO, READ BACK BY AND VERIFIED WITH: MATT  MCBANE AT 2220 ON 06/15/17 RWW    Staphylococcus aureus DETECTED (A) NOT DETECTED Final    Comment: Methicillin (oxacillin) susceptible Staphylococcus aureus (MSSA). Preferred therapy is anti staphylococcal beta lactam antibiotic (Cefazolin or Nafcillin), unless clinically contraindicated. CRITICAL RESULT CALLED TO, READ BACK BY AND VERIFIED WITH: MATT MCBANE AT 2220 ON 06/15/17/RWW    Methicillin resistance NOT DETECTED NOT DETECTED Final   Streptococcus species NOT DETECTED NOT DETECTED Final   Streptococcus agalactiae NOT DETECTED NOT DETECTED Final   Streptococcus pneumoniae NOT DETECTED NOT DETECTED Final   Streptococcus pyogenes NOT DETECTED NOT DETECTED Final   Acinetobacter baumannii NOT  DETECTED NOT DETECTED Final   Enterobacteriaceae species NOT DETECTED NOT DETECTED Final   Enterobacter cloacae complex NOT DETECTED NOT DETECTED Final   Escherichia coli NOT DETECTED NOT DETECTED Final   Klebsiella oxytoca NOT DETECTED NOT DETECTED Final   Klebsiella pneumoniae NOT DETECTED NOT DETECTED Final   Proteus species NOT DETECTED NOT DETECTED Final   Serratia marcescens NOT DETECTED NOT DETECTED Final   Haemophilus influenzae NOT DETECTED NOT DETECTED Final   Neisseria meningitidis NOT DETECTED NOT DETECTED Final   Pseudomonas aeruginosa NOT DETECTED NOT DETECTED Final   Candida albicans NOT DETECTED NOT DETECTED Final   Candida glabrata NOT DETECTED NOT DETECTED Final   Candida krusei NOT DETECTED NOT DETECTED Final   Candida parapsilosis NOT DETECTED NOT DETECTED Final   Candida tropicalis NOT DETECTED NOT DETECTED Final  Urine culture     Status: None   Collection Time: 06/16/17 12:49 PM  Result Value Ref Range Status   Specimen Description URINE, RANDOM  Final   Special Requests NONE  Final   Culture   Final    NO GROWTH Performed at [facility], [address], [place], [place] [address]    Report Status 06/17/2017 FINAL  Final  Culture, blood (single) w Reflex to ID Panel     Status: None (Preliminary result)   Collection Time: 06/17/17  3:44 PM  Result Value Ref Range Status   Specimen Description BLOOD Blood Culture adequate volume  Final   Special Requests RIGHT ANTECUBITAL  Final   Culture NO GROWTH 2 DAYS  Final   Report Status PENDING  Incomplete        Code Status Orders  (From admission, onward)        Start     Ordered   06/16/17 0703  Full code  Continuous     06/16/17 0702    Code Status History    Date Active Date Inactive Code Status Order ID Comments User Context   This patient has a current code status but no historical code status.            Discharge Medications   Allergies as of 06/19/2017   No Known Allergies      Medication List    TAKE these medications   acetaminophen 325 MG tablet Commonly known as:  TYLENOL Take 2 tablets (650 mg total) by mouth every 6 (six) hours as needed for mild pain (or Fever >/= 101).   cyclobenzaprine 10 MG tablet Commonly known as:  FLEXERIL Take 1 tablet (10 mg total) by mouth 2 (two) times daily as needed for muscle spasms. Do not drive while taking as can cause drowsiness   diazepam 5 MG tablet Commonly known as:  VALIUM Take 1 tablet (5 mg total) by mouth every 8 (eight) hours as needed for muscle spasms.   ibuprofen 800 MG tablet  Commonly known as:  ADVIL,MOTRIN Take 1 tablet (800 mg total) by mouth every 8 (eight) hours as needed.   lovastatin 20 MG tablet Commonly known as:  MEVACOR Take 20 mg by mouth every evening.   oxyCODONE-acetaminophen 5-325 MG tablet Commonly known as:  ROXICET Take 1 tablet by mouth every 6 (six) hours as needed.          Total Time in preparing paper work, data evaluation and todays exam - 35 minutes  Auburn Bilberry M.D on 06/19/2017 at 3:14 PM  [facility]   Office  [phone]

## 2017-06-19 NOTE — Progress Notes (Signed)
[facility] INFECTIOUS DISEASE PROGRESS NOTE Date of Admission:  06/16/2017     ID: Dutch Quint. is a 46 y.o. male with MSSA bacteremia Active Problems:   Sepsis ([place])   Subjective: No fevers, TEE neg. Still with some pain R chest wall  ROS  Eleven systems are reviewed and negative except per hpi  Medications:  Antibiotics Given (last 72 hours)    Date/Time Action Medication Dose Rate   06/16/17 1521 New Bag/Given   ceFAZolin (ANCEF) 2 g in dextrose 5 % 100 mL IVPB 2 g 200 mL/hr   06/16/17 2128 New Bag/Given   ceFAZolin (ANCEF) 2 g in dextrose 5 % 100 mL IVPB 2 g 200 mL/hr   06/17/17 0530 New Bag/Given   ceFAZolin (ANCEF) 2 g in dextrose 5 % 100 mL IVPB 2 g 200 mL/hr   06/17/17 1458 New Bag/Given   ceFAZolin (ANCEF) 2 g in dextrose 5 % 100 mL IVPB 2 g 200 mL/hr   06/17/17 2002 New Bag/Given   ceFAZolin (ANCEF) 2 g in dextrose 5 % 100 mL IVPB 2 g 200 mL/hr   06/18/17 0406 New Bag/Given   ceFAZolin (ANCEF) 2 g in dextrose 5 % 100 mL IVPB 2 g 200 mL/hr   06/18/17 1434 New Bag/Given   ceFAZolin (ANCEF) 2 g in dextrose 5 % 100 mL IVPB 2 g 200 mL/hr   06/18/17 2139 New Bag/Given   ceFAZolin (ANCEF) 2 g in dextrose 5 % 100 mL IVPB 2 g 200 mL/hr   06/19/17 0547 New Bag/Given   ceFAZolin (ANCEF) 2 g in dextrose 5 % 100 mL IVPB 2 g 200 mL/hr   06/19/17 1320 New Bag/Given   ceFAZolin (ANCEF) 2 g in dextrose 5 % 100 mL IVPB 2 g 200 mL/hr     . butamben-tetracaine-benzocaine      . docusate sodium  100 mg Oral BID  . enoxaparin (LOVENOX) injection  40 mg Subcutaneous Q24H  . fentaNYL      . fentaNYL      . lidocaine      . midazolam      . midazolam      . pravastatin  20 mg Oral q1800    Objective: Vital signs in last 24 hours: Temp:  [98.5 F (36.9 C)-99.1 F (37.3 C)] 99 F (37.2 C) (12/19 1316) Pulse Rate:  [83-109] 96 (12/19 1317) Resp:  [16-33] 18 (12/19 1317) BP: (139-165)/(79-97) 153/90 (12/19 1317) SpO2:  [92 %-95 %] 93 % (12/19 1317) Weight:  [92.5 kg  (204 lb)-92.9 kg (204 lb 12.8 oz)] 92.5 kg (204 lb) (12/19 1149) Constitutional: He is oriented to person, place, and time. He appears well-developed and well-nourished. No distress.  HENT: anicteric Mouth/Throat: Oropharynx is clear and moist. No oropharyngeal exudate.  Cardiovascular: Normal rate, regular rhythm and normal heart sounds.  Pulmonary/Chest:  bil rhonchi Abdominal: Soft. Bowel sounds are normal. He exhibits no distension. There is no tenderness.  Lymphadenopathy: He has no cervical adenopathy.  Neurological: He is alert and oriented to person, place, and time.  Skin:R ant chest wall over pec is mildly red   L scalp with a 1 cm wound, no redness Psychiatric: He has a normal mood and affect. His behavior is normal.     Lab Results Recent Labs    06/17/17 0429 06/19/17 0443  WBC 4.6  --   HGB 11.9*  --   HCT 34.8*  --   NA 139 140  K 3.4* 3.6  CL  109 105  CO2 24 26  BUN 12 10  CREATININE 0.89 0.93    Microbiology: Results for orders placed or performed during the hospital encounter of 06/16/17  Urine culture     Status: None   Collection Time: 06/16/17 12:49 PM  Result Value Ref Range Status   Specimen Description URINE, RANDOM  Final   Special Requests NONE  Final   Culture   Final    NO GROWTH Performed at [facility], [place] [address] [place], [place] [address]    Report Status 06/17/2017 FINAL  Final  Culture, blood (single) w Reflex to ID Panel     Status: None (Preliminary result)   Collection Time: 06/17/17  3:44 PM  Result Value Ref Range Status   Specimen Description BLOOD Blood Culture adequate volume  Final   Special Requests RIGHT ANTECUBITAL  Final   Culture NO GROWTH 2 DAYS  Final   Report Status PENDING  Incomplete    Studies/Results: Ct Chest Wo Contrast  Result Date: 06/17/2017 CLINICAL DATA:  Fever EXAM: CT CHEST WITHOUT CONTRAST TECHNIQUE: Multidetector CT imaging of the chest was performed following the standard protocol  without IV contrast. COMPARISON:  Chest x-ray 06/16/2017. FINDINGS: Cardiovascular: Heart size normal. Trace pericardial fluid noted. No thoracic aortic aneurysm. Mediastinum/Nodes: No mediastinal lymphadenopathy. Soft tissue fullness noted in the hilar regions bilaterally, but not well evaluated on this noncontrast exam. The esophagus has normal imaging features. There is no axillary lymphadenopathy. Lungs/Pleura: Interlobular septal thickening noted in the lung apices. Patchy ill-defined nodularity is seen in the central lungs bilaterally, right greater than left having a tree-in-bud centrilobular distribution in some regions. Features are more confluent in the right lower lobe with bite lateral lower lobe dependent collapse/consolidation. Small bilateral pleural effusions are associated. Upper Abdomen: 15 mm hypoattenuating lesion is identified in the left liver, incompletely characterize but potentially a cyst. Musculoskeletal: Edema seen in the subcutaneous tissues of the anterior chest wall, anterior to the sternum and in the right paramidline chest. This is associated with some retrosternal edema, progressive since neck CT of 06/15/2017. No focal fluid collection is evident. Features presumably related to the right pectoralis major tear seen on MRI of 06/17/2017. IMPRESSION: 1. Interlobular septal thickening with patchy ill-defined central ground-glass nodularity in both lungs, right greater than left. There is more confluent collapse/consolidation in the lower lobes bilaterally. Overall imaging features are not typical for septic emboli. Bilateral infectious/inflammatory etiology suspected including broncho pneumonia and atypical infection. Pulmonary edema or hemorrhage could present similarly. 2. Small bilateral pleural effusions with bilateral lower lobe collapse/consolidation dependently. 3. Edema in the medial aspect of the anterior right chest wall with associated retrosternal edema, progressive since  06/15/2017. This is presumably related to the pectoralis major injury identified on recent MRI. Electronically Signed   By: Misty Stanley M.D.   On: 06/17/2017 19:49   Echo    Impressions:  - There was no evidence of a vegetation. The right ventricular   systolic pressure was increased consistent with mild pulmonary   hypertension. normal left ventricular systolic function and mild   mitral regurgitation.   Assessment/Plan: Seng Larch. is a 46 y.o. male with MSSA bacteremia and shoulder pain.   His likely source is his L scalp wound.  His MRI does not show shoulder infection and he is clinically improving with regards to R shoulder pain but has some swelling and pain over R side of neck and chest.  Fu BCX P from  12/17.  TTE negative. ESR 67, CRP 21. CT chest with PNA but no septic emboli. TEE negative  Recommendations Can place picc 2 weeks IV ancef - stop date 12/31 - I will see him at that time. I will plan to treat with oral abx at that time since I am concerned there is residual infection.  Can give one dose of ceftriaxone 2 gm x 1 prior to DC so can start IV ancef in AM. Thank you very much for the consult. Will follow with you.  Leonel Ramsay   06/19/2017, 2:52 PM

## 2017-06-19 NOTE — Progress Notes (Signed)
Infectious Disease Long Term IV Antibiotic Orders Kailand Seda. 22-Jul-1970  Diagnosis: MSSA bacteremia, PNA  Culture results Organism ID, Bacteria STAPHYLOCOCCUS AUREUS   Resulting Agency [facility]  Susceptibility    Staphylococcus aureus    MIC    CIPROFLOXACIN <=0.5 SENSI... Sensitive    CLINDAMYCIN <=0.25 SENS... Sensitive    ERYTHROMYCIN <=0.25 SENS... Sensitive    GENTAMICIN <=0.5 SENSI... Sensitive    Inducible Clindamycin NEGATIVE  Sensitive    OXACILLIN <=0.25 SENS... Sensitive    RIFAMPIN <=0.5 SENSI... Sensitive    TETRACYCLINE <=1 SENSITIVE  Sensitive    TRIMETH/SULFA <=10 SENSIT... Sensitive    VANCOMYCIN <=0.5 SENSI... Sensitive        LABS Lab Results  Component Value Date   CREATININE 0.93 06/19/2017   Lab Results  Component Value Date   WBC 4.6 06/17/2017   HGB 11.9 (L) 06/17/2017   HCT 34.8 (L) 06/17/2017   MCV 89.6 06/17/2017   PLT 145 (L) 06/17/2017   Lab Results  Component Value Date   ESRSEDRATE 67 (H) 06/16/2017   Lab Results  Component Value Date   CRP 21.5 (H) 06/16/2017    Allergies: No Known Allergies  Discharge antibiotics     Cefazolin      2 grams every  8 hours  PICC Care per protocol Labs weekly while on IV antibiotics -FAX weekly labs to [phone] CBC w diff   Comprehensive met panel ESR  CRP   Planned duration of antibiotics 2 weeks from first neg BCX  Stop date 12.31.    Follow up clinic date 12/31   Leonel Ramsay, MD

## 2017-06-19 NOTE — Progress Notes (Signed)
*  PRELIMINARY RESULTS* Echocardiogram Echocardiogram Transesophageal has been performed.  Cristela BlueHege, Zarayah Lanting 06/19/2017, 12:36 PM

## 2017-06-19 NOTE — Care Management (Signed)
Notified Dr. Sampson GoonFitzgerald that IV antibiotics wont be started until tomorrow. He is in agreement and no new orders received.

## 2017-06-19 NOTE — Care Management (Addendum)
RNCM consult received for home IV antibiotics. Notified [facility] of impending DC and PICC line. Notified primary nurse and Dr. Allena KatzPatel that Iv antibiotics would not be started until the am. Patient updated on POC and is agreeable.

## 2017-06-19 NOTE — Progress Notes (Signed)
Pharmacy Antibiotic Note  Jeremy AmericanJoseph A Micciche Jr. is a 46 y.o. male admitted on 06/16/2017 with bacteremia.  Pharmacy has been consulted for cefazolin dosing.  Plan: Cefazolin 2 g iv q 8 hours.   Height: 5\' 8"  (172.7 cm) Weight: 204 lb 12.8 oz (92.9 kg) IBW/kg (Calculated) : 68.4  Temp (24hrs), Avg:98.5 F (36.9 C), Min:98 F (36.7 C), Max:99.1 F (37.3 C)  Recent Labs  Lab 06/15/17 0651 06/16/17 0548 06/16/17 0902 06/16/17 1324 06/16/17 1521 06/17/17 0429 06/19/17 0443  WBC 8.5 7.5  --   --   --  4.6  --   CREATININE 1.08 1.13  --   --   --  0.89 0.93  LATICACIDVEN 1.3 1.6 2.6* 1.6 1.4  --   --     Estimated Creatinine Clearance: 107.4 mL/min (by C-G formula based on SCr of 0.93 mg/dL).    No Known Allergies  Antimicrobials this admission: Cefazolin 12/16 >>   Dose adjustments this admission:  Microbiology results: UCx: NG 12/17 BCx x1 NGTD  Thank you for allowing pharmacy to be a part of this patient's care.  Jeremy Humphrey, Jeremy Humphrey 06/19/2017 9:31 AM

## 2017-06-19 NOTE — Progress Notes (Signed)
Peripherally Inserted Central Catheter/Midline Placement  The IV Nurse has discussed with the patient and/or persons authorized to consent for the patient, the purpose of this procedure and the potential benefits and risks involved with this procedure.  The benefits include less needle sticks, lab draws from the catheter, and the patient may be discharged home with the catheter. Risks include, but not limited to, infection, bleeding, blood clot (thrombus formation), and puncture of an artery; nerve damage and irregular heartbeat and possibility to perform a PICC exchange if needed/ordered by physician.  Alternatives to this procedure were also discussed.  Bard Power PICC patient education guide, fact sheet on infection prevention and patient information card has been provided to patient /or left at bedside.    PICC/Midline Placement Documentation  PICC Single Lumen 06/19/17 PICC Left Basilic 45 cm 0 cm (Active)  Indication for Insertion or Continuance of Line Home intravenous therapies (PICC only) 06/19/2017  4:36 PM  Exposed Catheter (cm) 0 cm 06/19/2017  4:36 PM  Site Assessment Clean;Dry;Intact 06/19/2017  4:36 PM  Line Status Flushed;Saline locked;Blood return noted 06/19/2017  4:36 PM  Dressing Type Transparent 06/19/2017  4:36 PM  Dressing Status Clean;Dry;Antimicrobial disc in place;Intact 06/19/2017  4:36 PM  Dressing Intervention New dressing 06/19/2017  4:36 PM  Dressing Change Due 06/26/17 06/19/2017  4:36 PM       Ethelda Chickurrie, Jahquan Klugh Robert 06/19/2017, 4:38 PM

## 2017-06-19 NOTE — Progress Notes (Signed)
Discharge instructions given and went over with patient and patients wife at bedside. Prescriptions given and reviewed. All questions answered. Patient discharged home with PICC line in place and home health via wheelchair by nursing staff. Bo McclintockBrewer,Tiannah Greenly S, RN

## 2017-06-20 ENCOUNTER — Encounter: Payer: Self-pay | Admitting: Cardiology

## 2017-06-22 LAB — CULTURE, BLOOD (SINGLE)
CULTURE: NO GROWTH
SPECIMEN DESCRIPTION: ADEQUATE

## 2017-08-03 ENCOUNTER — Encounter: Payer: Self-pay | Admitting: Emergency Medicine

## 2017-08-03 ENCOUNTER — Emergency Department: Payer: BLUE CROSS/BLUE SHIELD

## 2017-08-03 ENCOUNTER — Other Ambulatory Visit: Payer: Self-pay

## 2017-08-03 ENCOUNTER — Emergency Department
Admission: EM | Admit: 2017-08-03 | Discharge: 2017-08-03 | Disposition: A | Discharge status: Home / Self Care | Payer: BLUE CROSS/BLUE SHIELD | Attending: Emergency Medicine | Admitting: Emergency Medicine

## 2017-08-03 DIAGNOSIS — Z79899 Other long term (current) drug therapy: Secondary | ICD-10-CM | POA: Diagnosis not present

## 2017-08-03 DIAGNOSIS — J101 Influenza due to other identified influenza virus with other respiratory manifestations: Secondary | ICD-10-CM

## 2017-08-03 DIAGNOSIS — R509 Fever, unspecified: Secondary | ICD-10-CM | POA: Diagnosis present

## 2017-08-03 DIAGNOSIS — I471 Supraventricular tachycardia: Secondary | ICD-10-CM | POA: Insufficient documentation

## 2017-08-03 DIAGNOSIS — J09X2 Influenza due to identified novel influenza A virus with other respiratory manifestations: Secondary | ICD-10-CM | POA: Diagnosis not present

## 2017-08-03 LAB — CBC WITH DIFFERENTIAL/PLATELET
Basophils Absolute: 0 10*3/uL (ref 0–0.1)
Basophils Relative: 1 %
Eosinophils Absolute: 0.1 10*3/uL (ref 0–0.7)
Eosinophils Relative: 2 %
HEMATOCRIT: 39.9 % — AB (ref 40.0–52.0)
Hemoglobin: 13.8 g/dL (ref 13.0–18.0)
LYMPHS ABS: 0.5 10*3/uL — AB (ref 1.0–3.6)
LYMPHS PCT: 9 %
MCH: 30.2 pg (ref 26.0–34.0)
MCHC: 34.6 g/dL (ref 32.0–36.0)
MCV: 87.4 fL (ref 80.0–100.0)
MONO ABS: 0.8 10*3/uL (ref 0.2–1.0)
MONOS PCT: 15 %
NEUTROS ABS: 4.2 10*3/uL (ref 1.4–6.5)
Neutrophils Relative %: 73 %
Platelets: 218 10*3/uL (ref 150–440)
RBC: 4.57 MIL/uL (ref 4.40–5.90)
RDW: 13.5 % (ref 11.5–14.5)
WBC: 5.7 10*3/uL (ref 3.8–10.6)

## 2017-08-03 LAB — URINALYSIS, COMPLETE (UACMP) WITH MICROSCOPIC
Bacteria, UA: NONE SEEN
Bilirubin Urine: NEGATIVE
Glucose, UA: NEGATIVE mg/dL
Hgb urine dipstick: NEGATIVE
Ketones, ur: NEGATIVE mg/dL
Leukocytes, UA: NEGATIVE
Nitrite: NEGATIVE
PROTEIN: NEGATIVE mg/dL
RBC / HPF: NONE SEEN RBC/hpf (ref 0–5)
SPECIFIC GRAVITY, URINE: 1.014 (ref 1.005–1.030)
SQUAMOUS EPITHELIAL / LPF: NONE SEEN
pH: 7 (ref 5.0–8.0)

## 2017-08-03 LAB — BASIC METABOLIC PANEL
Anion gap: 9 (ref 5–15)
BUN: 13 mg/dL (ref 6–20)
CALCIUM: 9.7 mg/dL (ref 8.9–10.3)
CHLORIDE: 103 mmol/L (ref 101–111)
CO2: 26 mmol/L (ref 22–32)
CREATININE: 1.07 mg/dL (ref 0.61–1.24)
GFR calc Af Amer: >60 mL/min (ref >60)
GFR calc non Af Amer: >60 mL/min (ref >60)
GLUCOSE: 102 mg/dL — AB (ref 65–99)
Potassium: 3.7 mmol/L (ref 3.5–5.1)
Sodium: 138 mmol/L (ref 135–145)

## 2017-08-03 LAB — INFLUENZA PANEL BY PCR (TYPE A & B)
INFLAPCR: POSITIVE — AB
INFLBPCR: NEGATIVE

## 2017-08-03 MED ORDER — OSELTAMIVIR PHOSPHATE 75 MG PO CAPS: 75.0000 mg | ORAL_CAPSULE | Freq: Two times a day (BID) | ORAL | 0 refills | Status: AC

## 2017-08-03 MED ORDER — ACETAMINOPHEN 325 MG PO TABS
650.0000 mg | ORAL_TABLET | Freq: Once | ORAL | Status: AC
  Administered 2017-08-03: 650 mg via ORAL
  Filled 2017-08-03: qty 2

## 2017-08-03 NOTE — ED Triage Notes (Signed)
Here for body aches and cough. Headache but only when coughing.  Started with fever and increased HR at home today.  Just finished abx Monday for 6 week course r/t sepsis.  Had dayquil at 1100 today.

## 2017-08-03 NOTE — ED Notes (Signed)
Pt with wife reports productive cough and fever for the last 24 hours

## 2017-08-03 NOTE — ED Provider Notes (Signed)
[facility] Emergency Department Provider Note  ____________________________________________  Time seen: Approximately 7:49 PM  I have reviewed the triage vital signs and the nursing notes.   HISTORY  Chief Complaint Fever    HPI Jeremy Humphrey. is a 47 y.o. male presents to the emergency department with headache, myalgias, nonproductive cough and fever that started today.  Patient has a history of sepsis and became concerned.  He has been tolerating fluids by mouth.  He has experienced mildly diminished appetite but no emesis or diarrhea.  He denies chest pain, chest tightness, nausea and abdominal pain.   Past Medical History:  Diagnosis Date  . SVT (supraventricular tachycardia) (HCC) 12/2014    Patient Active Problem List   Diagnosis Date Noted  . Sepsis (HCC) 06/16/2017    Past Surgical History:  Procedure Laterality Date  . ABLATION     SVT  . TEE WITHOUT CARDIOVERSION N/A 06/19/2017   Procedure: TRANSESOPHAGEAL ECHOCARDIOGRAM (TEE);  Surgeon: Dalia Heading, MD;  Location: [facility] ORS;  Service: Cardiovascular;  Laterality: N/A;    Prior to Admission medications   Medication Sig Start Date End Date Taking? Authorizing Provider  acetaminophen (TYLENOL) 325 MG tablet Take 2 tablets (650 mg total) by mouth every 6 (six) hours as needed for mild pain (or Fever >/= 101). 06/19/17   Auburn Bilberry, MD  cyclobenzaprine (FLEXERIL) 10 MG tablet Take 1 tablet (10 mg total) by mouth 2 (two) times daily as needed for muscle spasms. Do not drive while taking as can cause drowsiness 06/12/17   Renford Dills, NP  diazepam (VALIUM) 5 MG tablet Take 1 tablet (5 mg total) by mouth every 8 (eight) hours as needed for muscle spasms. 06/19/17   Auburn Bilberry, MD  ibuprofen (ADVIL,MOTRIN) 800 MG tablet Take 1 tablet (800 mg total) by mouth every 8 (eight) hours as needed. 06/19/17   Auburn Bilberry, MD  lovastatin (MEVACOR) 20 MG tablet Take 20 mg by mouth  every evening. 02/27/16   [provider]  oseltamivir (TAMIFLU) 75 MG capsule Take 1 capsule (75 mg total) by mouth 2 (two) times daily for 5 days. 08/03/17 08/08/17  Orvil Feil, PA-C  oxyCODONE-acetaminophen (ROXICET) 5-325 MG tablet Take 1 tablet by mouth every 6 (six) hours as needed. 06/19/17 06/19/18  Auburn Bilberry, MD    Allergies Patient has no known allergies.  Family History  Problem Relation Age of Onset  . Cancer Father   . Diabetes Maternal Grandmother     Social History Social History   Tobacco Use  . Smoking status: Never Smoker  . Smokeless tobacco: Never Used  Substance Use Topics  . Alcohol use: Yes  . Drug use: Not on file     Review of Systems  Constitutional: Patient has fever.  Eyes: No visual changes. No discharge ENT: Patient has congestion.  Cardiovascular: no chest pain. Respiratory: Patient has cough.  Gastrointestinal: No abdominal pain.  No nausea, no vomiting. Patient had diarrhea.  Genitourinary: Negative for dysuria. No hematuria Musculoskeletal: Patient has myalgias.  Skin: Negative for rash, abrasions, lacerations, ecchymosis. Neurological: Patient has headache, no focal weakness or numbness.     ____________________________________________   PHYSICAL EXAM:  VITAL SIGNS: ED Triage Vitals  Enc Vitals Group     BP 08/03/17 1738 139/81     Pulse Rate 08/03/17 1738 100     Resp 08/03/17 1738 20     Temp 08/03/17 1738 (!) 102 F (38.9 C)     Temp  Source 08/03/17 1738 Oral     SpO2 08/03/17 1738 99 %     Weight 08/03/17 1736 195 lb (88.5 kg)     Height 08/03/17 1736 5\' 8"  (1.727 m)     Head Circumference --      Peak Flow --      Pain Score 08/03/17 1736 2     Pain Loc --      Pain Edu? --      Excl. in GC? --      Constitutional: Alert and oriented. Patient is lying supine. Eyes: Conjunctivae are normal. PERRL. EOMI. Head: Atraumatic. ENT:      Ears: Tympanic membranes are mildly injected with mild effusion  bilaterally.       Nose: No congestion/rhinnorhea.      Mouth/Throat: Mucous membranes are moist. Posterior pharynx is mildly erythematous.  Hematological/Lymphatic/Immunilogical: No cervical lymphadenopathy.  Cardiovascular: Normal rate, regular rhythm. Normal S1 and S2.  Good peripheral circulation. Respiratory: Normal respiratory effort without tachypnea or retractions. Lungs CTAB. Good air entry to the bases with no decreased or absent breath sounds. Gastrointestinal: Bowel sounds 4 quadrants. Soft and nontender to palpation. No guarding or rigidity. No palpable masses. No distention. No CVA tenderness. Musculoskeletal: Full range of motion to all extremities. No gross deformities appreciated. Neurologic:  Normal speech and language. No gross focal neurologic deficits are appreciated.  Skin:  Skin is warm, dry and intact. No rash noted. Psychiatric: Mood and affect are normal. Speech and behavior are normal. Patient exhibits appropriate insight and judgement.   ____________________________________________   LABS (all labs ordered are listed, but only abnormal results are displayed)  Labs Reviewed  CBC WITH DIFFERENTIAL/PLATELET - Abnormal; Notable for the following components:      Result Value   HCT 39.9 (*)    Lymphs Abs 0.5 (*)    All other components within normal limits  BASIC METABOLIC PANEL - Abnormal; Notable for the following components:   Glucose, Bld 102 (*)    All other components within normal limits  URINALYSIS, COMPLETE (UACMP) WITH MICROSCOPIC - Abnormal; Notable for the following components:   Color, Urine STRAW (*)    APPearance CLEAR (*)    All other components within normal limits  INFLUENZA PANEL BY PCR (TYPE A & B) - Abnormal; Notable for the following components:   Influenza A By PCR POSITIVE (*)    All other components within normal limits    ____________________________________________  EKG   ____________________________________________  RADIOLOGY Geraldo PitterI, Kenyada Dosch M Dardan Shelton, personally viewed and evaluated these images (plain radiographs) as part of my medical decision making, as well as reviewing the written report by the radiologist.  Dg Chest 2 View  Result Date: 08/03/2017 CLINICAL DATA:  Body ache and cough EXAM: CHEST  2 VIEW COMPARISON:  06/16/2017 FINDINGS: The heart size and mediastinal contours are within normal limits. Both lungs are clear. The visualized skeletal structures are unremarkable. IMPRESSION: No active cardiopulmonary disease. Electronically Signed   By: Tollie Ethavid  Kwon M.D.   On: 08/03/2017 18:58    ____________________________________________    PROCEDURES  Procedure(s) performed:    Procedures    Medications  acetaminophen (TYLENOL) tablet 650 mg (650 mg Oral Given 08/03/17 1749)     ____________________________________________   INITIAL IMPRESSION / ASSESSMENT AND PLAN / ED COURSE  Pertinent labs & imaging results that were available during my care of the patient were reviewed by me and considered in my medical decision making (see chart for details).  Review of  the [place] CSRS was performed in accordance of the [facility] prior to dispensing any controlled drugs.    Assessment and plan Influenza A Patient presents to the emergency department with headache, fever, nonproductive cough and myalgias.  Differential diagnosis included influenza versus unspecified viral URI.  Patient tested positive for influenza A in the emergency department.  He was discharged with Tamiflu.  Supplemental fluids were offered in the emergency department and patient declined at this time.  Patient was advised to follow-up with primary care as needed.  All patient questions were answered.     ____________________________________________  FINAL CLINICAL IMPRESSION(S) / ED DIAGNOSES  Final diagnoses:  Influenza A       NEW MEDICATIONS STARTED DURING THIS VISIT:  ED Discharge Orders        Ordered    oseltamivir (TAMIFLU) 75 MG capsule  2 times daily     08/03/17 1928          This chart was dictated using voice recognition software/Dragon. Despite best efforts to proofread, errors can occur which can change the meaning. Any change was purely unintentional.    Orvil Feil, PA-C 08/03/17 1952    Emily Filbert, MD 08/03/17 2025

## 2019-01-23 ENCOUNTER — Other Ambulatory Visit: Payer: Self-pay

## 2019-01-23 ENCOUNTER — Ambulatory Visit (INDEPENDENT_AMBULATORY_CARE_PROVIDER_SITE_OTHER): Payer: BC Managed Care – PPO

## 2019-01-23 ENCOUNTER — Ambulatory Visit
Admission: EM | Admit: 2019-01-23 | Discharge: 2019-01-23 | Disposition: A | Discharge status: Home / Self Care | Payer: BC Managed Care – PPO | Attending: Family Medicine | Admitting: Family Medicine

## 2019-01-23 ENCOUNTER — Encounter: Payer: Self-pay | Admitting: Emergency Medicine

## 2019-01-23 DIAGNOSIS — M79645 Pain in left finger(s): Secondary | ICD-10-CM | POA: Diagnosis not present

## 2019-01-23 NOTE — Discharge Instructions (Signed)
Ice, over the counter ibuprofen/tylenol as needed, rest Follow up with hand orthopedist

## 2019-01-23 NOTE — ED Provider Notes (Signed)
[facility]    CSN: [id number] Arrival date & time: 01/23/19  [id number]     History   Chief Complaint Chief Complaint  Patient presents with  . Hand Pain    left    HPI Jeremy Humphrey. is a 48 y.o. male.   48 yo male with a c/o left index finger pain for the past 2 weeks worsening over the last 3-4 days. Denies any specific traumatic injury, however states he works a lot with his hands and occasionally bumps that knuckle area. Also states he's been golfing more recently. Denies any rash, fevers, chills, drainage.      Past Medical History:  Diagnosis Date  . SVT (supraventricular tachycardia) (HCC) 12/2014    Patient Active Problem List   Diagnosis Date Noted  . Sepsis (HCC) 06/16/2017    Past Surgical History:  Procedure Laterality Date  . ABLATION     SVT  . TEE WITHOUT CARDIOVERSION N/A 06/19/2017   Procedure: TRANSESOPHAGEAL ECHOCARDIOGRAM (TEE);  Surgeon: Dalia HeadingFath, Kenneth A, MD;  Location: [facility] ORS;  Service: Cardiovascular;  Laterality: N/A;       Home Medications    Prior to Admission medications   Medication Sig Start Date End Date Taking? Authorizing Provider  acetaminophen (TYLENOL) 325 MG tablet Take 2 tablets (650 mg total) by mouth every 6 (six) hours as needed for mild pain (or Fever >/= 101). 06/19/17  Yes Auburn BilberryPatel, Shreyang, MD  cyclobenzaprine (FLEXERIL) 10 MG tablet Take 1 tablet (10 mg total) by mouth 2 (two) times daily as needed for muscle spasms. Do not drive while taking as can cause drowsiness 06/12/17   Renford DillsMiller, Lindsey, NP  diazepam (VALIUM) 5 MG tablet Take 1 tablet (5 mg total) by mouth every 8 (eight) hours as needed for muscle spasms. 06/19/17   Auburn BilberryPatel, Shreyang, MD  ibuprofen (ADVIL,MOTRIN) 800 MG tablet Take 1 tablet (800 mg total) by mouth every 8 (eight) hours as needed. 06/19/17   Auburn BilberryPatel, Shreyang, MD  lovastatin (MEVACOR) 20 MG tablet Take 20 mg by mouth every evening. 02/27/16   [provider]    Family History  Family History  Problem Relation Age of Onset  . Cancer Father   . Diabetes Maternal Grandmother     Social History Social History   Tobacco Use  . Smoking status: Never Smoker  . Smokeless tobacco: Never Used  Substance Use Topics  . Alcohol use: Yes  . Drug use: Not Currently     Allergies   Patient has no known allergies.   Review of Systems Review of Systems   Physical Exam Triage Vital Signs ED Triage Vitals  Enc Vitals Group     BP 01/23/19 0952 (!) 126/95     Pulse Rate 01/23/19 0952 75     Resp 01/23/19 0952 18     Temp 01/23/19 0952 98.1 F (36.7 C)     Temp Source 01/23/19 0952 Oral     SpO2 01/23/19 0952 98 %     Weight 01/23/19 0949 195 lb (88.5 kg)     Height 01/23/19 0949 5\' 9"  (1.753 m)     Head Circumference --      Peak Flow --      Pain Score 01/23/19 0949 2     Pain Loc --      Pain Edu? --      Excl. in GC? --    No data found.  Updated Vital Signs BP (!) 126/95 (BP Location: Left Arm)  Pulse 75   Temp 98.1 F (36.7 C) (Oral)   Resp 18   Ht 5\' 9"  (1.753 m)   Wt 88.5 kg   SpO2 98%   BMI 28.80 kg/m   Visual Acuity Right Eye Distance:   Left Eye Distance:   Bilateral Distance:    Right Eye Near:   Left Eye Near:    Bilateral Near:     Physical Exam Vitals signs and nursing note reviewed.  Constitutional:      General: He is not in acute distress.    Appearance: He is not toxic-appearing or diaphoretic.  Musculoskeletal:     Left hand: He exhibits bony tenderness (index finger MCP joint). He exhibits normal range of motion, normal two-point discrimination, normal capillary refill, no laceration and no swelling. Normal sensation noted. Normal strength noted.  Neurological:     Mental Status: He is alert.      UC Treatments / Results  Labs (all labs ordered are listed, but only abnormal results are displayed) Labs Reviewed - No data to display  EKG   Radiology Dg Finger Index Left  Result Date: 01/23/2019  CLINICAL DATA:  Pain, swelling EXAM: LEFT INDEX FINGER 2+V COMPARISON:  None. FINDINGS: There is no evidence of fracture or dislocation. There is no evidence of arthropathy or other focal bone abnormality. Soft tissues are unremarkable. IMPRESSION: Negative. Electronically Signed   By: Rolm Baptise M.D.   On: 01/23/2019 10:41    Procedures Procedures (including critical care time)  Medications Ordered in UC Medications - No data to display  Initial Impression / Assessment and Plan / UC Course  I have reviewed the triage vital signs and the nursing notes.  Pertinent labs & imaging results that were available during my care of the patient were reviewed by me and considered in my medical decision making (see chart for details).      Final Clinical Impressions(s) / UC Diagnoses   Final diagnoses:  Finger pain, left     Discharge Instructions     Ice, over the counter ibuprofen/tylenol as needed, rest Follow up with hand orthopedist    ED Prescriptions    None     1. x-ray result (negative) and diagnosis reviewed with patient 2. Recommend supportive treatment as above 3. Follow-up prn if symptoms worsen or don't improve   Controlled Substance Prescriptions [place] Controlled Substance Registry consulted? Not Applicable   Norval Gable, MD 01/23/19 [phone]

## 2019-01-23 NOTE — ED Triage Notes (Signed)
Pt c/o left hand pain. Started about a couple weeks ago but worse in the last couple of days. He states when he bends his fingers a certain way he feels a burning sensation.

## 2020-02-20 ENCOUNTER — Ambulatory Visit (INDEPENDENT_AMBULATORY_CARE_PROVIDER_SITE_OTHER): Payer: BC Managed Care – PPO

## 2020-02-20 ENCOUNTER — Encounter: Payer: Self-pay | Admitting: Emergency Medicine

## 2020-02-20 ENCOUNTER — Other Ambulatory Visit: Payer: Self-pay

## 2020-02-20 ENCOUNTER — Ambulatory Visit
Admission: EM | Admit: 2020-02-20 | Discharge: 2020-02-20 | Disposition: A | Discharge status: Home / Self Care | Payer: BC Managed Care – PPO | Attending: Family Medicine | Admitting: Family Medicine

## 2020-02-20 ENCOUNTER — Ambulatory Visit: Admit: 2020-02-20 | Discharge status: Absent without leave | Payer: Self-pay

## 2020-02-20 DIAGNOSIS — J189 Pneumonia, unspecified organism: Secondary | ICD-10-CM | POA: Diagnosis not present

## 2020-02-20 DIAGNOSIS — R509 Fever, unspecified: Secondary | ICD-10-CM

## 2020-02-20 DIAGNOSIS — U071 COVID-19: Secondary | ICD-10-CM

## 2020-02-20 MED ORDER — AMOXICILLIN-POT CLAVULANATE 875-125 MG PO TABS: 1.0000 | ORAL_TABLET | Freq: Two times a day (BID) | ORAL | 0 refills | Status: DC

## 2020-02-20 MED ORDER — AZITHROMYCIN 250 MG PO TABS: ORAL_TABLET | ORAL | 0 refills | Status: DC

## 2020-02-20 MED ORDER — KETOROLAC TROMETHAMINE 10 MG PO TABS: 10.0000 mg | ORAL_TABLET | Freq: Four times a day (QID) | ORAL | 0 refills | Status: DC | PRN

## 2020-02-20 NOTE — ED Triage Notes (Signed)
Patient c/o ongoing fevers that have not improved over the past 8 days.  Patient is COVID positive.  Patient does reports some pain in his back.

## 2020-02-20 NOTE — Discharge Instructions (Signed)
Medications as prescribed.  If you worsen, go directly to the hospital.  Take care  Dr. Adriana Simas

## 2020-02-21 NOTE — ED Provider Notes (Signed)
[facility]    CSN: [id number] Arrival date & time: 02/20/20  1501      History   Chief Complaint Chief Complaint  Patient presents with  . Fever  . Back Pain   HPI  49 year old male who is Covid positive presents with persistent fever and body aches.  Patient states that he is on day 8 of illness.  He is continuing to run fever.  Most recent fever was 102+.  He continues to have body aches.  He rates his pain is 8/10 in severity.  Patient is concerned given persistent fever and lack of improvement.  His wife has Covid as well and she has already improved after day 5.  No relieving factors.  No other complaints at this time.   Past Medical History:  Diagnosis Date  . SVT (supraventricular tachycardia) (HCC) 12/2014   Patient Active Problem List   Diagnosis Date Noted  . Sepsis (HCC) 06/16/2017   Past Surgical History:  Procedure Laterality Date  . ABLATION     SVT  . TEE WITHOUT CARDIOVERSION N/A 06/19/2017   Procedure: TRANSESOPHAGEAL ECHOCARDIOGRAM (TEE);  Surgeon: Dalia Heading, MD;  Location: [facility] OR[facility];  Service: Cardiovascular;  Laterality: N/A;    Home Medications    Prior to Admission medications   Medication Sig Start Date End Date Taking? Authorizing Provider  ibuprofen (ADVIL,MOTRIN) 800 MG tablet Take 1 tablet (800 mg total) by mouth every 8 (eight) hours as needed. 06/19/17  Yes Auburn Bilberry, MD  acetaminophen (TYLENOL) 325 MG tablet Take 2 tablets (650 mg total) by mouth every 6 (six) hours as needed for mild pain (or Fever >/= 101). 06/19/17   Auburn Bilberry, MD  amoxicillin-clavulanate (AUGMENTIN) 875-125 MG tablet Take 1 tablet by mouth 2 (two) times daily. 02/20/20   Tommie Sams, DO  azithromycin (ZITHROMAX) 250 MG tablet 2 tablets on day 1, then 1 tablet daily on days 2-5. 02/20/20   Tommie Sams, DO  cyclobenzaprine (FLEXERIL) 10 MG tablet Take 1 tablet (10 mg total) by mouth 2 (two) times daily as needed for muscle spasms. Do not  drive while taking as can cause drowsiness 06/12/17   Renford Dills, NP  ketorolac (TORADOL) 10 MG tablet Take 1 tablet (10 mg total) by mouth every 6 (six) hours as needed for moderate pain or severe pain. 02/20/20   Tommie Sams, DO  lovastatin (MEVACOR) 20 MG tablet Take 20 mg by mouth every evening. 02/27/16 02/20/20  [provider]    Family History Family History  Problem Relation Age of Onset  . Cancer Father   . Diabetes Maternal Grandmother     Social History Social History   Tobacco Use  . Smoking status: Never Smoker  . Smokeless tobacco: Never Used  Vaping Use  . Vaping Use: Never used  Substance Use Topics  . Alcohol use: Yes  . Drug use: Not Currently     Allergies   Patient has no known allergies.   Review of Systems Review of Systems  Constitutional: Positive for fatigue and fever.  Respiratory:       Mild cough.  Musculoskeletal:       Body aches.   Physical Exam Triage Vital Signs ED Triage Vitals  Enc Vitals Group     BP 02/20/20 1529 (!) 146/109     Pulse Rate 02/20/20 1529 87     Resp 02/20/20 1529 16     Temp 02/20/20 1529 98.4 F (36.9 C)  Temp Source 02/20/20 1529 Oral     SpO2 02/20/20 1529 97 %     Weight 02/20/20 1527 200 lb (90.7 kg)     Height 02/20/20 1527 5\' 9"  (1.753 m)     Head Circumference --      Peak Flow --      Pain Score 02/20/20 1527 8     Pain Loc --      Pain Edu? --      Excl. in GC? --    Updated Vital Signs BP (!) 146/109 (BP Location: Left Arm)   Pulse 87   Temp 98.4 F (36.9 C) (Oral)   Resp 16   Ht 5\' 9"  (1.753 m)   Wt 90.7 kg   SpO2 97%   BMI 29.53 kg/m   Visual Acuity Right Eye Distance:   Left Eye Distance:   Bilateral Distance:    Right Eye Near:   Left Eye Near:    Bilateral Near:     Physical Exam Vitals and nursing note reviewed.  Constitutional:      Appearance: He is not ill-appearing.     Comments: Appears fatigued.  HENT:     Head: Normocephalic and atraumatic.   Eyes:     General:        Right eye: No discharge.        Left eye: No discharge.     Conjunctiva/sclera: Conjunctivae normal.  Cardiovascular:     Rate and Rhythm: Normal rate and regular rhythm.  Pulmonary:     Effort: Pulmonary effort is normal.     Comments: Diffuse crackles. Neurological:     Mental Status: He is alert.  Psychiatric:        Mood and Affect: Mood normal.        Behavior: Behavior normal.    UC Treatments / Results  Labs (all labs ordered are listed, but only abnormal results are displayed) Labs Reviewed - No data to display  EKG   Radiology DG Chest 2 View  Result Date: 02/20/2020 CLINICAL DATA:  49 year old male with fever and positive COVID. EXAM: CHEST - 2 VIEW COMPARISON:  08/03/2017 FINDINGS: The cardiomediastinal silhouette is unremarkable. Patchy airspace opacities within bilateral mid and lower lungs is compatible with infection/pneumonia. No pleural effusion or pneumothorax noted. No acute bony abnormalities are present. IMPRESSION: Patchy airspace opacities within bilateral mid and lower lungs compatible with infection/pneumonia. Radiographic follow-up to resolution is recommended. Electronically Signed   By: 42 M.D.   On: 02/20/2020 15:56    Procedures Procedures (including critical care time)  Medications Ordered in UC Medications - No data to display  Initial Impression / Assessment and Plan / UC Course  I have reviewed the triage vital signs and the nursing notes.  Pertinent labs & imaging results that were available during my care of the patient were reviewed by me and considered in my medical decision making (see chart for details).    49 year old male presents with persistent fever in the setting of COVID-19.  Chest x-ray performed and independently reviewed by me.  Interpretation: Bilateral infiltrates noted in the lower lung fields.  I suspect that this is COVID-19 pneumonia.  However, cannot rule out secondary bacterial  infection.  Given persistent fever and x-ray findings, I am placing him on Augmentin and azithromycin.  Toradol for body aches.  Advised to go to the hospital if he worsens.  Final Clinical Impressions(s) / UC Diagnoses   Final diagnoses:  COVID-19  Pneumonia of  both lungs due to infectious organism, unspecified part of lung     Discharge Instructions     Medications as prescribed.  If you worsen, go directly to the hospital.  Take care  Dr. Adriana Simas    ED Prescriptions    Medication Sig Dispense Auth. Provider   amoxicillin-clavulanate (AUGMENTIN) 875-125 MG tablet Take 1 tablet by mouth 2 (two) times daily. 20 tablet Cloys Vera G, DO   azithromycin (ZITHROMAX) 250 MG tablet 2 tablets on day 1, then 1 tablet daily on days 2-5. 6 tablet Neddie Steedman G, DO   ketorolac (TORADOL) 10 MG tablet Take 1 tablet (10 mg total) by mouth every 6 (six) hours as needed for moderate pain or severe pain. 20 tablet Tommie Sams, DO     PDMP not reviewed this encounter.   Tommie Sams, [place] 02/21/20 [phone]

## 2020-02-28 ENCOUNTER — Encounter: Payer: Self-pay | Admitting: Emergency Medicine

## 2020-02-28 ENCOUNTER — Other Ambulatory Visit: Payer: Self-pay

## 2020-02-28 ENCOUNTER — Ambulatory Visit
Admission: EM | Admit: 2020-02-28 | Discharge: 2020-02-28 | Disposition: A | Discharge status: Home / Self Care | Payer: BC Managed Care – PPO | Attending: Family Medicine | Admitting: Family Medicine

## 2020-02-28 DIAGNOSIS — R05 Cough: Secondary | ICD-10-CM | POA: Diagnosis not present

## 2020-02-28 DIAGNOSIS — Z7689 Persons encountering health services in other specified circumstances: Secondary | ICD-10-CM

## 2020-02-28 NOTE — Discharge Instructions (Signed)
Okay to return to work.  Take care  Dr. Serenah Mill  

## 2020-02-28 NOTE — ED Triage Notes (Signed)
Patient reports some chest congestion of the right side.  Patient states that he needs a doctors note to return to work.  Patient denies fevers.

## 2020-02-28 NOTE — ED Provider Notes (Signed)
[facility]    CSN: [id number] Arrival date & time: 02/28/20  1114      History   Chief Complaint Chief Complaint  Patient presents with  . Cough   HPI  49 year old male presents for return to work evaluation.  Patient recently diagnosed with COVID-19 on 8/14.  Recently saw me on 8/21.  Diagnosed with pneumonia.  Concern for possible secondary infection and was placed on Augmentin and azithromycin.  Patient states that he has improved and is feeling much better.  He has completed 14.  Patient states that he is ready to go back to work.  He has not had a fever for 3 days.  Patient is requesting a note so that he may return back to work this coming Thursday.  No other complaints or concerns at this time.  Past Medical History:  Diagnosis Date  . SVT (supraventricular tachycardia) (HCC) 12/2014    Patient Active Problem List   Diagnosis Date Noted  . Sepsis (HCC) 06/16/2017    Past Surgical History:  Procedure Laterality Date  . ABLATION     SVT  . TEE WITHOUT CARDIOVERSION N/A 06/19/2017   Procedure: TRANSESOPHAGEAL ECHOCARDIOGRAM (TEE);  Surgeon: Dalia Heading, MD;  Location: [facility] OR[facility];  Service: Cardiovascular;  Laterality: N/A;       Home Medications    Prior to Admission medications   Medication Sig Start Date End Date Taking? Authorizing Provider  acetaminophen (TYLENOL) 325 MG tablet Take 2 tablets (650 mg total) by mouth every 6 (six) hours as needed for mild pain (or Fever >/= 101). 06/19/17   Auburn Bilberry, MD  amoxicillin-clavulanate (AUGMENTIN) 875-125 MG tablet Take 1 tablet by mouth 2 (two) times daily. 02/20/20   Tommie Sams, DO  azithromycin (ZITHROMAX) 250 MG tablet 2 tablets on day 1, then 1 tablet daily on days 2-5. 02/20/20   Tommie Sams, DO  cyclobenzaprine (FLEXERIL) 10 MG tablet Take 1 tablet (10 mg total) by mouth 2 (two) times daily as needed for muscle spasms. Do not drive while taking as can cause drowsiness 06/12/17   Renford Dills, NP  ibuprofen (ADVIL,MOTRIN) 800 MG tablet Take 1 tablet (800 mg total) by mouth every 8 (eight) hours as needed. 06/19/17   Auburn Bilberry, MD  ketorolac (TORADOL) 10 MG tablet Take 1 tablet (10 mg total) by mouth every 6 (six) hours as needed for moderate pain or severe pain. 02/20/20   Tommie Sams, DO  lovastatin (MEVACOR) 20 MG tablet Take 20 mg by mouth every evening. 02/27/16 02/20/20  [provider]    Family History Family History  Problem Relation Age of Onset  . Cancer Father   . Diabetes Maternal Grandmother     Social History Social History   Tobacco Use  . Smoking status: Never Smoker  . Smokeless tobacco: Never Used  Vaping Use  . Vaping Use: Never used  Substance Use Topics  . Alcohol use: Yes  . Drug use: Not Currently     Allergies   Patient has no known allergies.   Review of Systems Review of Systems  Constitutional: Negative for fever.   Physical Exam Triage Vital Signs ED Triage Vitals  Enc Vitals Group     BP 02/28/20 1200 (!) 117/93     Pulse Rate 02/28/20 1200 60     Resp 02/28/20 1200 16     Temp 02/28/20 1200 98.5 F (36.9 C)     Temp Source 02/28/20 1200 Oral  SpO2 02/28/20 1200 100 %     Weight 02/28/20 1159 199 lb 15.3 oz (90.7 kg)     Height --      Head Circumference --      Peak Flow --      Pain Score 02/28/20 1158 0     Pain Loc --      Pain Edu? --      Excl. in GC? --    Updated Vital Signs BP (!) 117/93 (BP Location: Left Arm)   Pulse 60   Temp 98.5 F (36.9 C) (Oral)   Resp 16   Wt 90.7 kg   SpO2 100%   BMI 29.53 kg/m   Visual Acuity Right Eye Distance:   Left Eye Distance:   Bilateral Distance:    Right Eye Near:   Left Eye Near:    Bilateral Near:     Physical Exam Vitals and nursing note reviewed.  Constitutional:      General: He is not in acute distress.    Appearance: Normal appearance. He is not ill-appearing.  HENT:     Head: Normocephalic and atraumatic.  Eyes:      General:        Right eye: No discharge.        Left eye: No discharge.     Conjunctiva/sclera: Conjunctivae normal.  Cardiovascular:     Rate and Rhythm: Normal rate and regular rhythm.     Heart sounds: No murmur heard.   Pulmonary:     Effort: Pulmonary effort is normal.     Breath sounds: Normal breath sounds. No wheezing, rhonchi or rales.  Neurological:     Mental Status: He is alert.  Psychiatric:        Mood and Affect: Mood normal.        Behavior: Behavior normal.    UC Treatments / Results  Labs (all labs ordered are listed, but only abnormal results are displayed) Labs Reviewed - No data to display  EKG   Radiology No results found.  Procedures Procedures (including critical care time)  Medications Ordered in UC Medications - No data to display  Initial Impression / Assessment and Plan / UC Course  I have reviewed the triage vital signs and the nursing notes.  Pertinent labs & imaging results that were available during my care of the patient were reviewed by me and considered in my medical decision making (see chart for details).    49 year old male presents for return to work evaluation.  Patient has completed quarantine for COVID-19.  He is improved and has been fever free for 72 hours.  He may return to work.  Final Clinical Impressions(s) / UC Diagnoses   Final diagnoses:  Return to work evaluation     Discharge Instructions     Okay to return to work.  Take care  Dr. Adriana Simas    ED Prescriptions    None     PDMP not reviewed this encounter.   Tommie Sams, [place] 02/28/20 1338

## 2020-06-14 ENCOUNTER — Ambulatory Visit
Admission: EM | Admit: 2020-06-14 | Discharge: 2020-06-14 | Disposition: A | Discharge status: Home / Self Care | Payer: BC Managed Care – PPO | Attending: Emergency Medicine | Admitting: Emergency Medicine

## 2020-06-14 ENCOUNTER — Other Ambulatory Visit: Payer: Self-pay

## 2020-06-14 DIAGNOSIS — L03012 Cellulitis of left finger: Secondary | ICD-10-CM | POA: Insufficient documentation

## 2020-06-14 MED ORDER — CEPHALEXIN 500 MG PO CAPS: 500.0000 mg | ORAL_CAPSULE | Freq: Three times a day (TID) | ORAL | 0 refills | Status: AC

## 2020-06-14 NOTE — Discharge Instructions (Addendum)
Take the Keflex 3 times a day with food for 10 days.  Continue to soak your left thumb in warm water and Epson salts 2-3 times a day to help facilitate drainage of the infection.  If you develop increasing redness, swelling, pain, or develop red streaks going up your arm you need to go to the ER for evaluation.

## 2020-06-14 NOTE — ED Provider Notes (Signed)
[facility]    CSN: [id number] Arrival date & time: 06/14/20  1533      History   Chief Complaint Chief Complaint  Patient presents with  . Nail Problem    Left thumb    HPI Jeremy Humphrey. is a 49 y.o. male.   HPI   49 year old male here for evaluation of swelling and pain to his left thumb.  Patient reports that he developed symptoms 2 days ago and then this morning the swelling and redness started to increase and he noticed a little yellow pocket forming under the skin.  Patient has been soaking it at home and salt water soaks but it has not been draining.  Patient denies fever.  Past Medical History:  Diagnosis Date  . SVT (supraventricular tachycardia) (HCC) 12/2014    Patient Active Problem List   Diagnosis Date Noted  . Sepsis (HCC) 06/16/2017    Past Surgical History:  Procedure Laterality Date  . ABLATION     SVT  . TEE WITHOUT CARDIOVERSION N/A 06/19/2017   Procedure: TRANSESOPHAGEAL ECHOCARDIOGRAM (TEE);  Surgeon: Dalia Heading, MD;  Location: [facility] ORS;  Service: Cardiovascular;  Laterality: N/A;       Home Medications    Prior to Admission medications   Medication Sig Start Date End Date Taking? Authorizing Provider  cephALEXin (KEFLEX) 500 MG capsule Take 1 capsule (500 mg total) by mouth 3 (three) times daily for 10 days. 06/14/20 06/24/20  Becky Augusta, NP  lovastatin (MEVACOR) 20 MG tablet Take 20 mg by mouth every evening. 02/27/16 02/20/20  [provider]    Family History Family History  Problem Relation Age of Onset  . Cancer Father   . Diabetes Maternal Grandmother     Social History Social History   Tobacco Use  . Smoking status: Never Smoker  . Smokeless tobacco: Never Used  Vaping Use  . Vaping Use: Never used  Substance Use Topics  . Alcohol use: Yes  . Drug use: Not Currently     Allergies   Patient has no known allergies.   Review of Systems Review of Systems  Constitutional: Negative  for activity change, appetite change and fever.  Musculoskeletal: Positive for arthralgias and myalgias.  Skin: Positive for color change.  Hematological: Negative.   Psychiatric/Behavioral: Negative.      Physical Exam Triage Vital Signs ED Triage Vitals  Enc Vitals Group     BP      Pulse      Resp      Temp      Temp src      SpO2      Weight      Height      Head Circumference      Peak Flow      Pain Score      Pain Loc      Pain Edu?      Excl. in GC?    No data found.  Updated Vital Signs BP (!) 142/90 (BP Location: Left Arm)   Pulse 70   Temp 98.3 F (36.8 C) (Oral)   Resp 18   Ht 5\' 9"  (1.753 m)   Wt 200 lb (90.7 kg)   SpO2 100%   BMI 29.53 kg/m   Visual Acuity Right Eye Distance:   Left Eye Distance:   Bilateral Distance:    Right Eye Near:   Left Eye Near:    Bilateral Near:     Physical Exam Vitals  and nursing note reviewed.  Constitutional:      General: He is not in acute distress.    Appearance: Normal appearance. He is normal weight. He is not toxic-appearing.  HENT:     Head: Normocephalic and atraumatic.  Cardiovascular:     Rate and Rhythm: Normal rate and regular rhythm.     Pulses: Normal pulses.     Heart sounds: Normal heart sounds. No murmur heard. No gallop.   Pulmonary:     Effort: Pulmonary effort is normal.     Breath sounds: Normal breath sounds. No wheezing, rhonchi or rales.  Musculoskeletal:        General: Swelling and tenderness present.  Skin:    General: Skin is warm and dry.     Capillary Refill: Capillary refill takes less than 2 seconds.     Findings: Erythema present.  Neurological:     General: No focal deficit present.     Mental Status: He is alert and oriented to person, place, and time.  Psychiatric:        Mood and Affect: Mood normal.        Behavior: Behavior normal.        Thought Content: Thought content normal.        Judgment: Judgment normal.      UC Treatments / Results   Labs (all labs ordered are listed, but only abnormal results are displayed) Labs Reviewed  AEROBIC CULTURE (SUPERFICIAL SPECIMEN)    EKG   Radiology No results found.  Procedures Procedures (including critical care time)  Medications Ordered in UC Medications - No data to display  Initial Impression / Assessment and Plan / UC Course  I have reviewed the triage vital signs and the nursing notes.  Pertinent labs & imaging results that were available during my care of the patient were reviewed by me and considered in my medical decision making (see chart for details).   Patient is here for evaluation of pain and swelling to the lateral aspect of his left thumb.  There is erythema and fluctuance to the lateral aspect with a tan fluid pocket visible under the skin.  Patient has full sensation and range of motion.  There are no red streaks ascending the finger.  The redness warmth and swelling is isolated to the proximal and lateral cuticle.  Patient does report that he chews his fingernails from time to time.  Pus pocket poked with a 21-gauge needle and sample collected and sent to lab for culture.  Site dressed with a Band-Aid.  Will put patient on Keflex 5 mg 3 times daily x10 days for paronychia.   Final Clinical Impressions(s) / UC Diagnoses   Final diagnoses:  Paronychia of left thumb     Discharge Instructions     Take the Keflex 3 times a day with food for 10 days.  Continue to soak your left thumb in warm water and Epson salts 2-3 times a day to help facilitate drainage of the infection.  If you develop increasing redness, swelling, pain, or develop red streaks going up your arm you need to go to the ER for evaluation.    ED Prescriptions    Medication Sig Dispense Auth. Provider   cephALEXin (KEFLEX) 500 MG capsule Take 1 capsule (500 mg total) by mouth 3 (three) times daily for 10 days. 30 capsule Becky Augusta, NP     PDMP not reviewed this encounter.   Becky Augusta, NP 06/14/20 [phone]

## 2020-06-14 NOTE — ED Triage Notes (Signed)
Patient states that he has been noticing some redness and tenderness at the base of his left thumb nail. Patient states that area started on Sunday and worsened this morning.

## 2020-06-18 LAB — AEROBIC CULTURE W GRAM STAIN (SUPERFICIAL SPECIMEN): Special Requests: NORMAL

## 2021-01-11 ENCOUNTER — Other Ambulatory Visit: Payer: Self-pay | Admitting: Internal Medicine

## 2021-01-11 DIAGNOSIS — R110 Nausea: Secondary | ICD-10-CM

## 2021-01-11 DIAGNOSIS — R198 Other specified symptoms and signs involving the digestive system and abdomen: Secondary | ICD-10-CM

## 2021-01-25 ENCOUNTER — Ambulatory Visit
Admission: RE | Admit: 2021-01-25 | Discharge: 2021-01-25 | Disposition: A | Discharge status: Home / Self Care | Payer: BC Managed Care – PPO | Source: Ambulatory Visit | Attending: Internal Medicine | Admitting: Internal Medicine

## 2021-01-25 ENCOUNTER — Other Ambulatory Visit: Payer: Self-pay

## 2021-01-25 DIAGNOSIS — R198 Other specified symptoms and signs involving the digestive system and abdomen: Secondary | ICD-10-CM | POA: Diagnosis present

## 2021-01-25 DIAGNOSIS — R110 Nausea: Secondary | ICD-10-CM | POA: Insufficient documentation

## 2021-05-21 IMAGING — CR DG CHEST 2V
2 series · 2 of 2 positions shown · non-contrast
Comparison: 08/03/2017

CLINICAL DATA: 48-year-old male with fever and positive COVID.

EXAM:
CHEST - 2 VIEW

[chest pa]
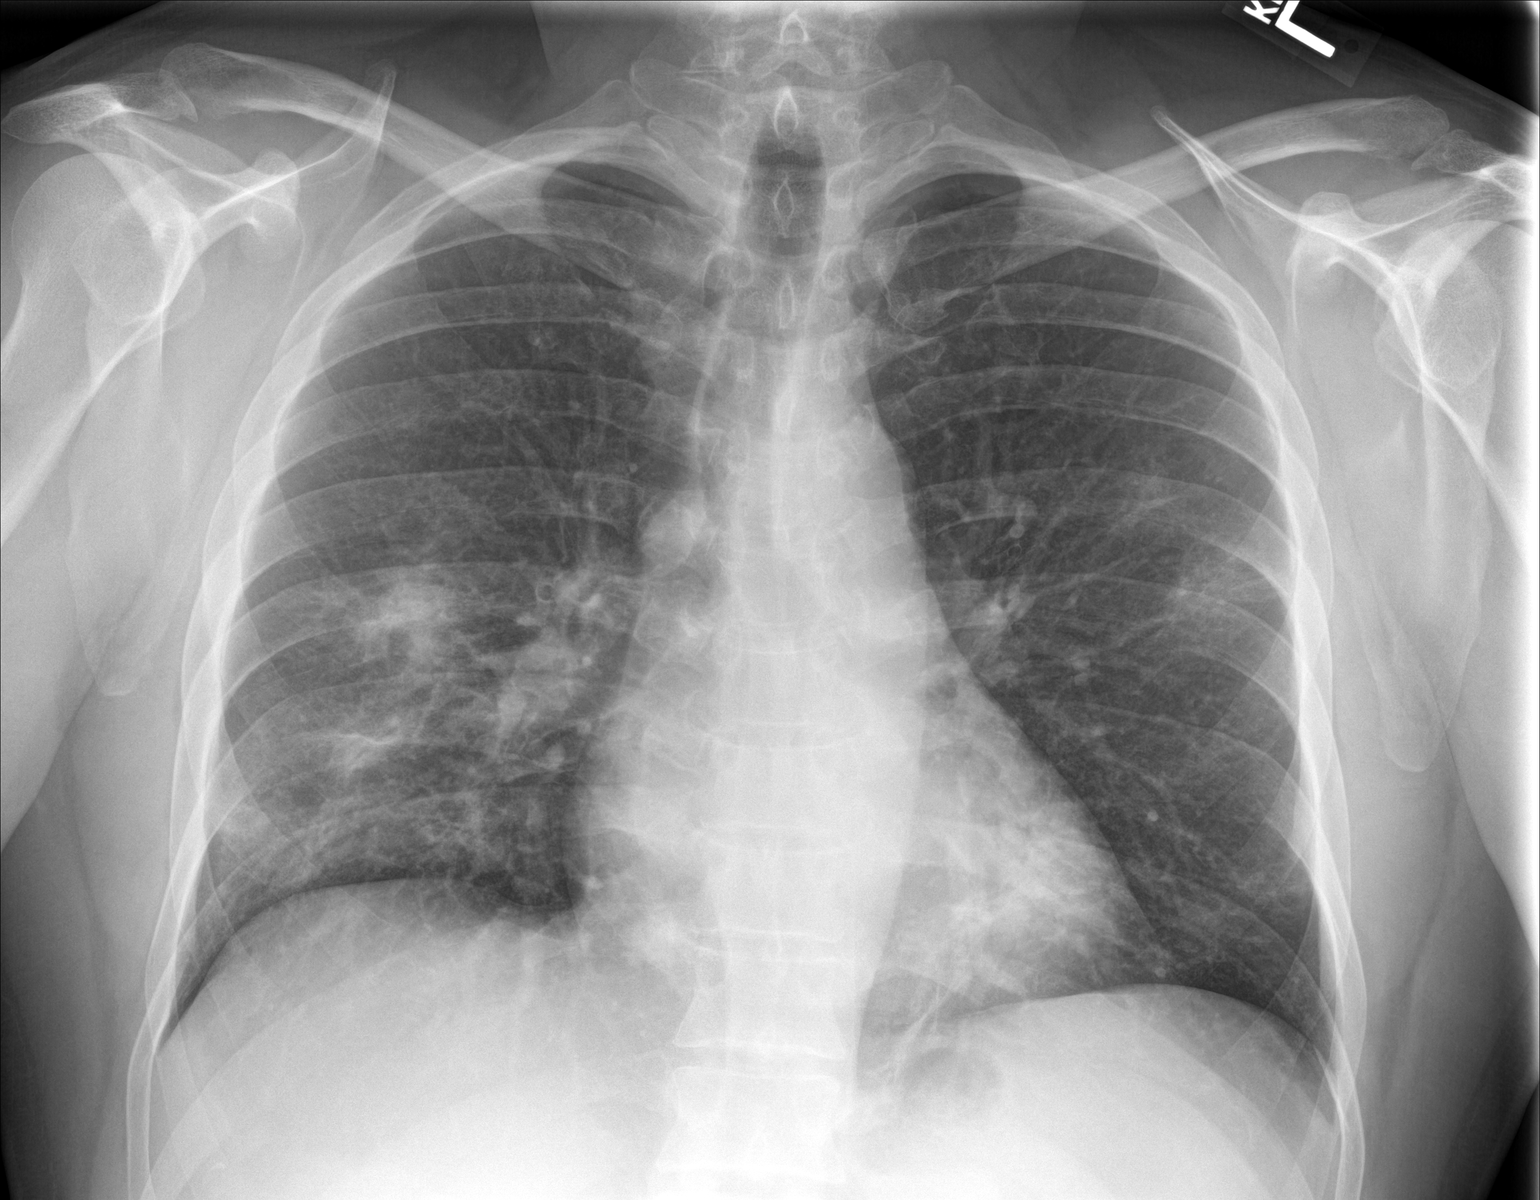

[chest lat]
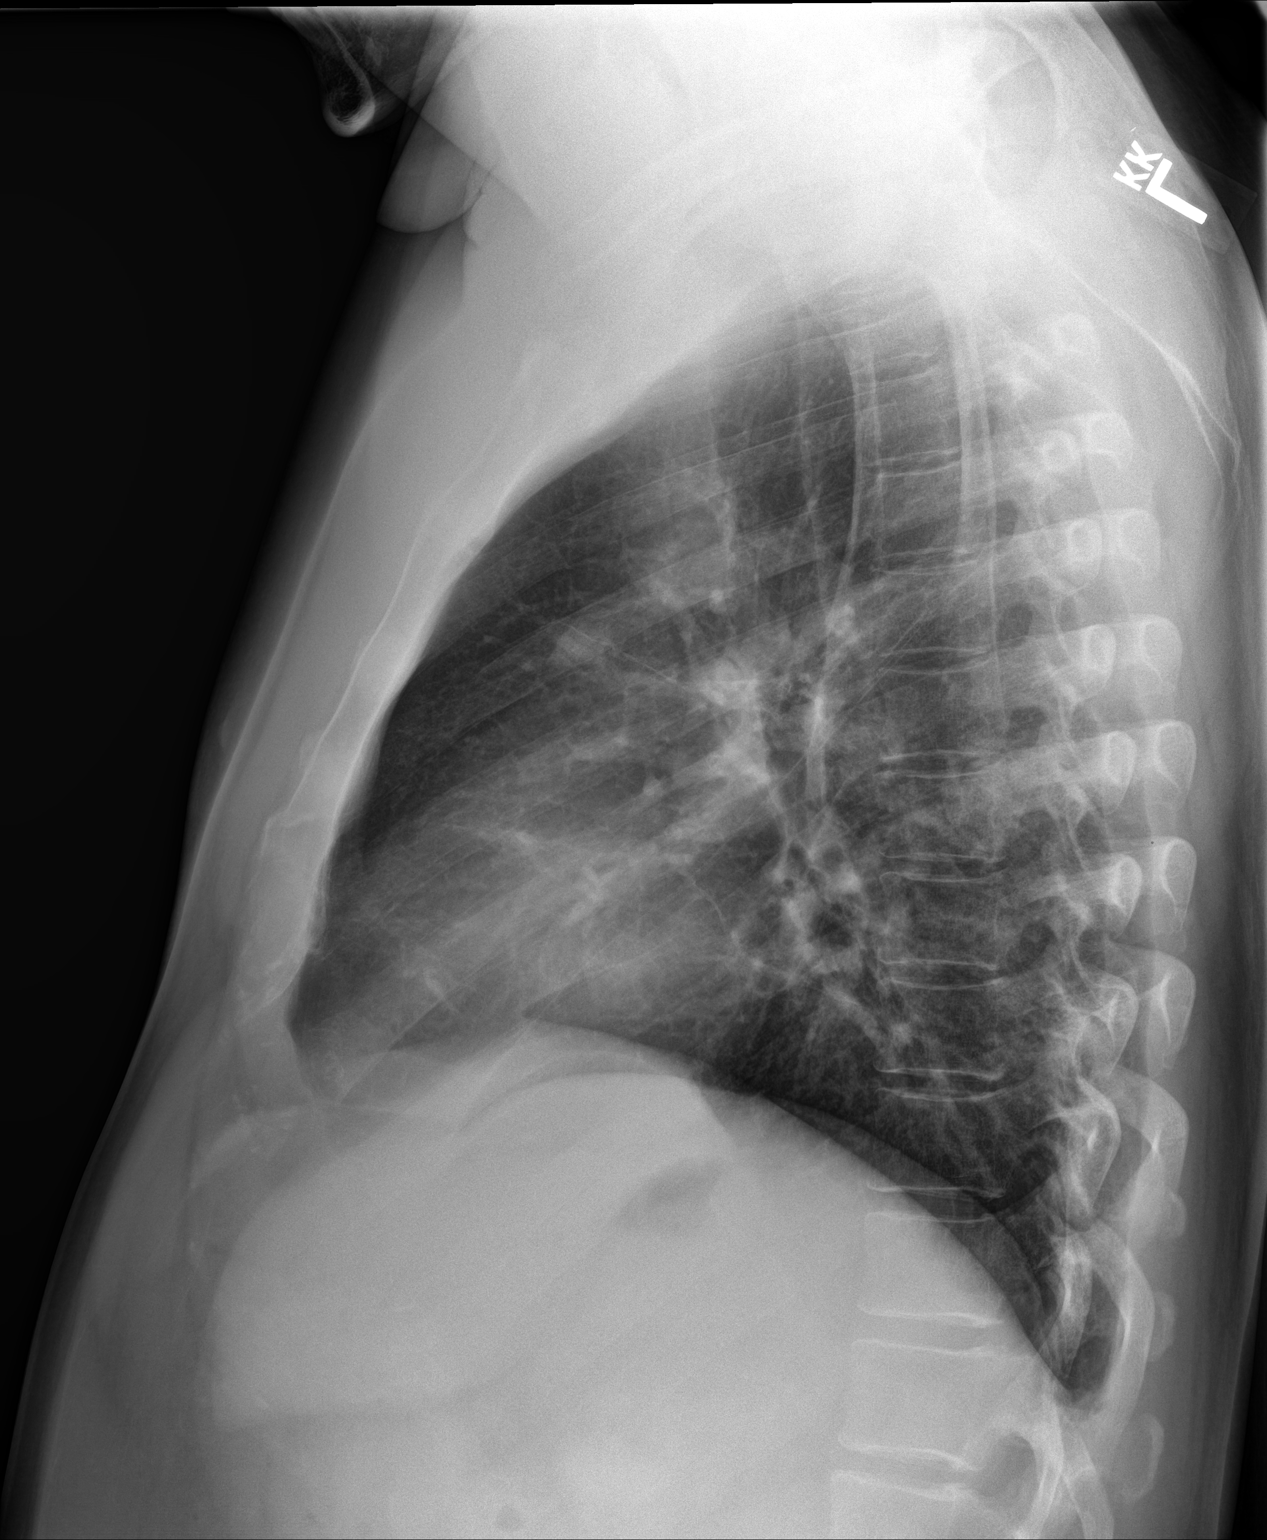

[2 of 2 positions shown; findings below may reference images not displayed]

FINDINGS: The cardiomediastinal silhouette is unremarkable.

Patchy airspace opacities within bilateral mid and lower lungs is
compatible with infection/pneumonia.

No pleural effusion or pneumothorax noted.

No acute bony abnormalities are present.
IMPRESSION: Patchy airspace opacities within bilateral mid and lower lungs
compatible with infection/pneumonia. Radiographic follow-up to
resolution is recommended.

## 2023-01-14 ENCOUNTER — Ambulatory Visit
Admission: RE | Admit: 2023-01-14 | Discharge: 2023-01-14 | Disposition: A | Discharge status: Home / Self Care | Payer: Managed Care, Other (non HMO) | Source: Ambulatory Visit

## 2023-01-14 VITALS — BP 128/84 | HR 62 | Temp 98.0°F

## 2023-01-14 DIAGNOSIS — L247 Irritant contact dermatitis due to plants, except food: Secondary | ICD-10-CM | POA: Diagnosis not present

## 2023-01-14 MED ORDER — PREDNISONE 10 MG PO TABS
ORAL_TABLET | ORAL | 0 refills | Status: AC
Start: 2023-01-14 — End: ?

## 2023-01-14 NOTE — Discharge Instructions (Signed)
Recommend you use an antihistamine to treat any itching from the rash.  Suggest acid reducing medicine such as famotidine to treat a common side effect of acid indigestion from the medication prescribed.

## 2023-01-14 NOTE — ED Triage Notes (Signed)
Pt presents to UC c/o rash on RT forearm and abdomen, pt states he got into poison ivy x 2 Fridays ago.

## 2023-01-14 NOTE — ED Provider Notes (Signed)
[facility]    CSN: [id number] Arrival date & time: 01/14/23  0841      History   Chief Complaint Chief Complaint  Patient presents with   Rash    HPI Jeremy Humphrey. is a 52 y.o. male.    Rash  Presents with rash on right forearm and abdomen.  Patient states contact with poison ivy 2 weeks ago.  Past Medical History:  Diagnosis Date   SVT (supraventricular tachycardia) 12/2014    Patient Active Problem List   Diagnosis Date Noted   Sepsis (HCC) 06/16/2017    Past Surgical History:  Procedure Laterality Date   ABLATION     SVT   TEE WITHOUT CARDIOVERSION N/A 06/19/2017   Procedure: TRANSESOPHAGEAL ECHOCARDIOGRAM (TEE);  Surgeon: Dalia Heading, MD;  Location: [facility] OR[facility];  Service: Cardiovascular;  Laterality: N/A;       Home Medications    Prior to Admission medications   Medication Sig Start Date End Date Taking? Authorizing Provider  rosuvastatin (CRESTOR) 10 MG tablet Take 1 tablet by mouth daily. 01/19/22 01/19/23 Yes [provider]  lovastatin (MEVACOR) 20 MG tablet Take 20 mg by mouth every evening. 02/27/16 02/20/20  [provider]    Family History Family History  Problem Relation Age of Onset   Cancer Father    Diabetes Maternal Grandmother     Social History Social History   Tobacco Use   Smoking status: Never   Smokeless tobacco: Never  Vaping Use   Vaping status: Never Used  Substance Use Topics   Alcohol use: Yes   Drug use: Not Currently     Allergies   Patient has no known allergies.   Review of Systems Review of Systems  Skin:  Positive for rash.     Physical Exam Triage Vital Signs ED Triage Vitals [01/14/23 0906]  Encounter Vitals Group     BP      Systolic BP Percentile      Diastolic BP Percentile      Pulse      Resp      Temp      Temp src      SpO2      Weight      Height      Head Circumference      Peak Flow      Pain Score 0     Pain Loc      Pain Education       Exclude from Growth Chart    No data found.  Updated Vital Signs There were no vitals taken for this visit.  Visual Acuity Right Eye Distance:   Left Eye Distance:   Bilateral Distance:    Right Eye Near:   Left Eye Near:    Bilateral Near:     Physical Exam Vitals reviewed.  Constitutional:      Appearance: Normal appearance.  Skin:    General: Skin is warm.     Findings: Erythema and rash present. Rash is vesicular.       Neurological:     General: No focal deficit present.     Mental Status: He is alert and oriented to person, place, and time.  Psychiatric:        Mood and Affect: Mood normal.        Behavior: Behavior normal.      UC Treatments / Results  Labs (all labs ordered are listed, but only abnormal results are displayed) Labs Reviewed -  No data to display  EKG   Radiology No results found.  Procedures Procedures (including critical care time)  Medications Ordered in UC Medications - No data to display  Initial Impression / Assessment and Plan / UC Course  I have reviewed the triage vital signs and the nursing notes.  Pertinent labs & imaging results that were available during my care of the patient were reviewed by me and considered in my medical decision making (see chart for details).   Jeremy Humphrey. is a 52 y.o. male presenting with rash. Patient is afebrile without recent antipyretics, satting well on room air. Overall is well appearing , well hydrated, without respiratory distress.  Vesicular rash on right forearm and right abdomen.  Reviewed relevant chart history.   Contact dermatitis due to poison ivy/oak.  Given the duration of his symptoms, will treat with oral steroid.  Discussed typical side effects of this treatment plan which the patient states he has tolerated in the past.  Also recommending use of antihistamine for itching and H2 blocker to treat possible side effect of acid indigestion.  Counseled patient on  potential for adverse effects with medications prescribed/recommended today, ER and return-to-clinic precautions discussed, patient verbalized understanding and agreement with care plan.  Final Clinical Impressions(s) / UC Diagnoses   Final diagnoses:  None   Discharge Instructions   None    ED Prescriptions   None    PDMP not reviewed this encounter.   Charma Igo, [place] 01/14/23 [phone]
